# Patient Record
Sex: Female | Born: 1970 | Race: Black or African American | Hispanic: No | Marital: Married | Smoking: Never smoker
Health system: Southern US, Community
[De-identification: ages and names within clinical notes are randomized; demographics above are authoritative.]

## PROBLEM LIST (undated history)

## (undated) DIAGNOSIS — F419 Anxiety disorder, unspecified: Secondary | ICD-10-CM

## (undated) HISTORY — PX: CHOLECYSTECTOMY: SHX55

## (undated) HISTORY — DX: Anxiety disorder, unspecified: F41.9

---

## 1998-01-25 ENCOUNTER — Emergency Department (HOSPITAL_COMMUNITY): Admission: EM | Admit: 1998-01-25 | Discharge: 1998-01-25 | Payer: Self-pay | Admitting: Emergency Medicine

## 1999-10-09 ENCOUNTER — Emergency Department (HOSPITAL_COMMUNITY): Admission: EM | Admit: 1999-10-09 | Discharge: 1999-10-09 | Payer: Self-pay | Admitting: *Deleted

## 1999-10-09 ENCOUNTER — Encounter: Payer: Self-pay | Admitting: Emergency Medicine

## 1999-10-20 ENCOUNTER — Encounter (INDEPENDENT_AMBULATORY_CARE_PROVIDER_SITE_OTHER): Payer: Self-pay | Admitting: Specialist

## 1999-10-20 ENCOUNTER — Encounter: Payer: Self-pay | Admitting: Surgery

## 1999-10-20 ENCOUNTER — Observation Stay (HOSPITAL_COMMUNITY): Admission: EM | Admit: 1999-10-20 | Discharge: 1999-10-22 | Payer: Self-pay | Admitting: Surgery

## 1999-10-21 ENCOUNTER — Encounter: Payer: Self-pay | Admitting: *Deleted

## 2000-07-02 ENCOUNTER — Other Ambulatory Visit: Admission: RE | Admit: 2000-07-02 | Discharge: 2000-07-02 | Payer: Self-pay | Admitting: Obstetrics & Gynecology

## 2000-09-10 ENCOUNTER — Encounter: Payer: Self-pay | Admitting: Obstetrics & Gynecology

## 2000-09-10 ENCOUNTER — Inpatient Hospital Stay (HOSPITAL_COMMUNITY): Admission: AD | Admit: 2000-09-10 | Discharge: 2000-09-12 | Payer: Self-pay | Admitting: Obstetrics and Gynecology

## 2001-04-22 ENCOUNTER — Other Ambulatory Visit: Admission: RE | Admit: 2001-04-22 | Discharge: 2001-04-22 | Payer: Self-pay | Admitting: Obstetrics & Gynecology

## 2001-05-09 ENCOUNTER — Ambulatory Visit (HOSPITAL_COMMUNITY): Admission: RE | Admit: 2001-05-09 | Discharge: 2001-05-09 | Payer: Self-pay | Admitting: Obstetrics & Gynecology

## 2001-10-17 ENCOUNTER — Inpatient Hospital Stay (HOSPITAL_COMMUNITY): Admission: AD | Admit: 2001-10-17 | Discharge: 2001-10-17 | Payer: Self-pay | Admitting: Obstetrics & Gynecology

## 2001-10-24 ENCOUNTER — Inpatient Hospital Stay (HOSPITAL_COMMUNITY): Admission: AD | Admit: 2001-10-24 | Discharge: 2001-10-24 | Payer: Self-pay | Admitting: Obstetrics & Gynecology

## 2001-11-08 ENCOUNTER — Inpatient Hospital Stay (HOSPITAL_COMMUNITY): Admission: AD | Admit: 2001-11-08 | Discharge: 2001-11-10 | Payer: Self-pay | Admitting: Obstetrics & Gynecology

## 2005-01-26 ENCOUNTER — Emergency Department (HOSPITAL_COMMUNITY): Admission: EM | Admit: 2005-01-26 | Discharge: 2005-01-26 | Payer: Self-pay | Admitting: Emergency Medicine

## 2005-03-02 ENCOUNTER — Emergency Department (HOSPITAL_COMMUNITY): Admission: EM | Admit: 2005-03-02 | Discharge: 2005-03-02 | Payer: Self-pay | Admitting: Emergency Medicine

## 2006-02-20 ENCOUNTER — Ambulatory Visit: Payer: Self-pay | Admitting: Endocrinology

## 2006-03-01 ENCOUNTER — Ambulatory Visit: Payer: Self-pay | Admitting: Endocrinology

## 2007-05-31 ENCOUNTER — Encounter: Payer: Self-pay | Admitting: Endocrinology

## 2008-03-08 ENCOUNTER — Emergency Department (HOSPITAL_COMMUNITY): Admission: EM | Admit: 2008-03-08 | Discharge: 2008-03-08 | Payer: Self-pay | Admitting: Family Medicine

## 2009-12-27 ENCOUNTER — Emergency Department (HOSPITAL_COMMUNITY): Admission: EM | Admit: 2009-12-27 | Discharge: 2009-12-27 | Payer: Self-pay | Admitting: Family Medicine

## 2011-02-17 NOTE — H&P (Signed)
[facility]  Patient:    Sydney Simon, Sydney Simon Visit Number: [id number] MRN: [id number]          Service Type: OBS Location: [id number] Attending Physician:  Genia Del Dictated by:   Genia Del, M.D. Admit Date:  11/08/2001 Disc. Date: 11/08/01                           History and Physical  DATE OF BIRTH:                1971/07/26  IDENTIFICATION:               The patient is a 40 year old, G3, P1, AB1, last menstrual period Jan 31, 2001, for an expected date of delivery November 08, 2001.  REASON FOR ADMISSION:         Induction for borderline PIH.  HISTORY OF PRESENT ILLNESS:   Fetal movement positive. No vaginal bleeding. No fluid leak. Occasional uterine contractions. No PIH symptoms, except for occasional headaches and increased swelling in her lower limbs.  PAST MEDICAL HISTORY:         Negative.  PAST SURGICAL HISTORY:        Positive for cholecystectomy in February 2001, and therapeutic abortion in 1993.  PAST OBSTETRICAL HISTORY:     In 1993, therapeutic abortion, no complication. December 2001, 20-week premature preterm rupture of membranes with probably cervical incompetence. No evidence of preterm labor. Had spontaneous vaginal delivery with fetal demise.  ALLERGIES:                    No known drug allergies.  SOCIAL HISTORY:               Married. Nonsmoker.  MEDICATIONS:                  Prenatal vitamins.  HISTORY OF PRESENT PREGNANCY:                    The patient had her new OB visit at 11+ weeks gestation. An ultrasound confirmed dating at that time. A small subchorionic hematoma with a low-lying placenta was present; therefore, the cerclage procedure was postponed at 13+ weeks. The placenta was posterior, normal and subchorionic hematoma had resolved. A McDonald cerclage was, therefore, done on May 09, 2001, after informed consent. In the second trimester, the patient had a triple test done, which  was within normal limits. An ultrasound review anatomy done at [facility] was within normal limits. Her cervix remained stable, along the second trimester. In the third trimester, the cervix remained stable as well.  One-hour GTT at 28 weeks was increased at 145, three-hour GTT was normal. An ultrasound done at 36 weeks showed an estimated fetal weight at the 46th percentile, amniotic fluid index at the 71st percentile, biophysical profile 8/8. Group B strep at 35+ weeks was negative. At 37 weeks, on October 17, 2001, the cerclage was removed without difficulty at maternity admission. Her cervix, after removal of the cerclage at 37+ weeks, was 3 cm, 85% effaced, vertex -2/-1.  Examination at 37+ weeks, her blood pressures were slightly increased, numbers ranging from 136/96 to 142/94. Proteins were negative and PIH labs were negative. Repeat blood pressures on the left side went down to a normal range at 124/72. Given the borderline blood pressures with a very favorable cervix and history of incompetence, the decision was made to proceed with  induction on November 08, 2001.  REVIEW OF SYSTEMS: CONSTITUTIONAL: Negative. HEENT: Negative. RESPIRATORY: Negative. CARDIOVASCULAR: Negative. GASTROINTESTINAL: Negative. UROLOGIC: Negative. DERMATOLOGIC: Negative. ENDOCRINE: Negative. NEUROLOGIC: Negative.  PHYSICAL EXAMINATION:  GENERAL:                      No apparent distress.  VITAL SIGNS:                  Blood pressures on arrival 150/99, then down to 131/78, heart rate 117 on admission then decreased to 95 per minute. Temperature 98. Respiratory rate 20.  LUNGS:                        Clear bilaterally.  HEART:                        Regular cardiac rhythm. No murmur.  ABDOMEN:                      Gravid. Uterine height corresponds, nontender.  PELVIC:                       Vaginal examination, 3 cm, 80% effaced, vertex -3/-2.  EXTREMITIES:                  Lower limbs normal  with mild edema. No clonus.  Monitoring: No regular uterine contractions on admission. Fetal heart rate monitoring 130 per minute, good variability with accelerations. No decelerations.  IMPRESSION:                   Gravida 3, para 1, abortus 1, living children 0, at [redacted] weeks gestation. Induction for borderline pregnancy-induced hypertension. 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 Fetal well-being reassuring. Group B streptococcus negative.  PLAN:                         Admit to labor and delivery.  Monitoring. Induction with Pitocin and artificial rupture of membranes. Expectant nature towards probable vaginal delivery. Dictated by:   Genia Del, M.D. Attending Physician:  Genia Del DD:  11/08/01 TD:  11/08/01 Job: [id number]

## 2011-02-17 NOTE — H&P (Signed)
[facility]  Patient:    Sydney Simon, Sydney Simon                      MRN: [id number] Adm. Date:  09/10/00 Attending:  Lenoard Aden, M.D.                         History and Physical  CHIEF COMPLAINT:              Spotting.  HISTORY OF PRESENT ILLNESS:   [age]-year-old black female, G2, P0-0-1-0, EDD January 25, 2001, at 20-3/7 weeks, who presents with a less than 3-hour history of vaginal spotting.  No history of antecedent intercourse.  PAST MEDICAL HISTORY:         Cholecystectomy in February 2001.  Positive PPD, with a negative chest x-ray.  No other medical issues or hospitalizations.  FAMILY HISTORY:               Hypertension.  SOCIAL HISTORY:               Noncontributory.  ALLERGIES:                    No known drug allergies.  MEDICATIONS:                  Prenatal vitamins.  PRENATAL LABORATORY DATA:     Blood type O positive.  Rh antibody negative. Sickle cell trait negative.  Rubella immune.  Hepatitis B surface antigen negative.  Hemoglobin electrophoresis normal.  HIV nonreactive.  Triple screen performed at 16 weeks was normal.  PHYSICAL EXAMINATION:  GENERAL:                      Well-developed, well-nourished ______ female in no apparent distress.  VITAL SIGNS:                  Blood pressure 142/88.  NECK:                         No thyromegaly.  ABDOMEN:                      Soft, gravid, nontender.  Estimated fundal height of 22-23.  No CVA tenderness.  EXTREMITIES:                  No cords.  PELVIC:                       Cervical exam reveals bulging amniotic membranes with the cervix to be close to fully dilated, palpable cervix only from 10 to 2 oclock.  NEUROLOGIC:                   Nonfocal.  IMPRESSION:                   Questionable new-onset cervical incompetence with advanced cervical dilatation, now at 20-3/7 weeks.  PLAN:                         Admit.  Start IV antibiotics.  Complete obstetric  ultrasound ordered.  Bed rest.  Possible need for emergent cerclage. Discussed with the patient.  Will place on tocolysis only, and check fetal heart tones each shift. DD:  09/10/00 TD:  09/10/00 Job: [id number]

## 2011-02-17 NOTE — Op Note (Signed)
[facility]  Patient:    Sydney Simon, Sydney Simon Visit Number: [id number] MRN: [id number]          Service Type: DSU Location: [facility] Attending Physician:  Genia Del Proc. Date: 05/09/01 Admit Date:  05/09/2001                             Operative Report  DATE OF BIRTH:                [birth year].  PREOPERATIVE DIAGNOSIS:       A 14-week gestation with history of incompetent cervix.  POSTOPERATIVE DIAGNOSIS:      A 14-week gestation with history of incompetent cervix.  OPERATION:                    Cervical cerclage, McDonald technique.  SURGEON:                      Genia Del, M.D.  ASSISTANT:                    Sung Amabile. Roslyn Smiling, M.D.  ANESTHESIOLOGIST:             Raul Del, M.D.  ANESTHESIA:                   Spinal.  ESTIMATED BLOOD LOSS:         Minimal.  DESCRIPTION OF PROCEDURE:     Under spinal anesthesia, the patient is in lithotomy position. She is prepped with Betadine on the suprapubic, vulvar, and vaginal areas. The patient is then draped as usual. A bladder catheterization is done. We do a vaginal exam revealing a closed cervix measuring about 3 cm in length, no vaginal bleeding is present. The speculum is inserted. A Betadine prep is done once more at the cervix. We then grasp the cervix with a small fenestrated tenaculum at 3 oclock and 9 oclock. We start the cerclage at 12 oclock with a 5 mm Mersilene stitch on a large blunt needle. We go from 12 oclock to 3 oclock at about 2.5 cm from the external os. We push in the cervix slightly to make sure that we are distal to the bladder. We go from 12 oclock to 3 oclock and come out at that level, then we enter at 3 oclock to come out at 6 oclock, reentering at 6 oclock to 9 oclock, and then from 9 to 12 oclock at about 1 cm from the beginning of the cerclage. We then reenter at 12 oclock and come out at just beside the origin of the cerclage. We then place a 0  Dexon just within our Mersilene stitch. We make one knot and verify the tension. We then complete with about six flat knots. We then attach the 0 Dexon and then cut both sutures short. Hemostasis is adequate. We apply Betadine once more on the cervix. We remove the instruments. The vaginal exam reveals a long cervix, more than 3 cm, tightly closed, firmer than before the cerclage. The knot is at 12 oclock. We then ______ the bladder catheter, urine is clear, and a rectal exam is done. No suture material is felt. The estimated blood loss was minimal. No complication occurred and the patient was transferred to recovery room in good status. She received Ancef 1 g IV at the beginning of the intervention. Attending Physician:  Genia Del DD:  05/09/01  TD:  05/10/01 Job: [id number]

## 2013-02-25 ENCOUNTER — Other Ambulatory Visit: Payer: Self-pay | Admitting: Family Medicine

## 2013-02-25 DIAGNOSIS — Z1231 Encounter for screening mammogram for malignant neoplasm of breast: Secondary | ICD-10-CM

## 2013-03-07 ENCOUNTER — Ambulatory Visit
Admission: RE | Admit: 2013-03-07 | Discharge: 2013-03-07 | Disposition: A | Discharge status: Home / Self Care | Payer: 59 | Source: Ambulatory Visit | Attending: Family Medicine | Admitting: Family Medicine

## 2013-03-07 ENCOUNTER — Other Ambulatory Visit: Payer: Self-pay | Admitting: Family Medicine

## 2013-03-07 DIAGNOSIS — R05 Cough: Secondary | ICD-10-CM

## 2013-03-12 ENCOUNTER — Ambulatory Visit: Payer: 59 | Attending: Family Medicine | Admitting: Physical Therapy

## 2013-03-12 DIAGNOSIS — M25519 Pain in unspecified shoulder: Secondary | ICD-10-CM | POA: Insufficient documentation

## 2013-03-12 DIAGNOSIS — M25619 Stiffness of unspecified shoulder, not elsewhere classified: Secondary | ICD-10-CM | POA: Insufficient documentation

## 2013-03-12 DIAGNOSIS — R293 Abnormal posture: Secondary | ICD-10-CM | POA: Insufficient documentation

## 2013-03-12 DIAGNOSIS — IMO0001 Reserved for inherently not codable concepts without codable children: Secondary | ICD-10-CM | POA: Insufficient documentation

## 2013-03-25 ENCOUNTER — Ambulatory Visit: Payer: 59 | Admitting: Physical Therapy

## 2013-04-07 ENCOUNTER — Ambulatory Visit: Payer: 59 | Attending: Family Medicine | Admitting: Physical Therapy

## 2013-04-07 DIAGNOSIS — M25619 Stiffness of unspecified shoulder, not elsewhere classified: Secondary | ICD-10-CM | POA: Insufficient documentation

## 2013-04-07 DIAGNOSIS — R293 Abnormal posture: Secondary | ICD-10-CM | POA: Insufficient documentation

## 2013-04-07 DIAGNOSIS — IMO0001 Reserved for inherently not codable concepts without codable children: Secondary | ICD-10-CM | POA: Insufficient documentation

## 2013-04-07 DIAGNOSIS — M25519 Pain in unspecified shoulder: Secondary | ICD-10-CM | POA: Insufficient documentation

## 2013-04-10 ENCOUNTER — Ambulatory Visit: Payer: 59 | Admitting: Rehabilitation

## 2013-04-15 ENCOUNTER — Ambulatory Visit: Payer: 59 | Admitting: Rehabilitation

## 2013-04-22 ENCOUNTER — Ambulatory Visit: Payer: 59 | Admitting: Rehabilitation

## 2013-04-24 ENCOUNTER — Encounter: Payer: 59 | Admitting: Rehabilitation

## 2016-11-06 ENCOUNTER — Ambulatory Visit (INDEPENDENT_AMBULATORY_CARE_PROVIDER_SITE_OTHER): Payer: 59

## 2016-11-06 ENCOUNTER — Ambulatory Visit (INDEPENDENT_AMBULATORY_CARE_PROVIDER_SITE_OTHER): Payer: 59 | Admitting: Physician Assistant

## 2016-11-06 ENCOUNTER — Encounter: Payer: Self-pay | Admitting: Physician Assistant

## 2016-11-06 VITALS — BP 142/94 | HR 97 | Temp 99.1°F | Resp 22 | Wt 240.0 lb

## 2016-11-06 DIAGNOSIS — K5900 Constipation, unspecified: Secondary | ICD-10-CM | POA: Diagnosis not present

## 2016-11-06 DIAGNOSIS — R450 Nervousness: Secondary | ICD-10-CM | POA: Diagnosis not present

## 2016-11-06 DIAGNOSIS — R42 Dizziness and giddiness: Secondary | ICD-10-CM | POA: Diagnosis not present

## 2016-11-06 DIAGNOSIS — R002 Palpitations: Secondary | ICD-10-CM

## 2016-11-06 DIAGNOSIS — R6881 Early satiety: Secondary | ICD-10-CM

## 2016-11-06 MED ORDER — POLYETHYLENE GLYCOL 3350 17 GM/SCOOP PO POWD
17.0000 g | Freq: Two times a day (BID) | ORAL | 1 refills | Status: DC | PRN
Start: 2016-11-06 — End: 2022-03-10

## 2016-11-06 NOTE — Progress Notes (Signed)
Sydney Simon  MRN: [id number] DOB: 20-Sep-1971  PCP: No primary care provider on file.  Subjective:  Pt is a 46 year old female who presents to clinic for palpitations.  "stomach feels full" She eats a little and gets "stuffed full" This has been going on about 2 weeks. Feels like her bowels are not moving well.  H/o constipation. Does not strain with bowel movements.  Heart racing x 2 weeks. Her apple watch shows normal is 80's, this morning was 115. Happens randomly, no increased life stressors. She has "always felt anxious" but it has gotten worse the past 2 weeks. "I can be a keyed up person by nature". +feeling jittery, occasional light-headedness. She is concerned her sugars are high. No history of diabetes.  Stopped eating sugar about 2 weeks ago.  Stress test a few years ago - negative.  Family history - HTN.  No history of thyroid dysfunction.  Denies chest pain or pressure, headache, syncope, dizziness, diaphoresis, fatigue, nausea, vomiting.   Review of Systems  Constitutional: Positive for appetite change. Negative for chills, diaphoresis, fatigue and fever.  HENT: Negative for congestion, postnasal drip, rhinorrhea, sinus pressure, sneezing and sore throat.   Respiratory: Negative for cough, chest tightness, shortness of breath and wheezing.   Cardiovascular: Positive for palpitations. Negative for chest pain.  Gastrointestinal: Negative for abdominal pain, diarrhea, nausea and vomiting.  Neurological: Positive for light-headedness. Negative for weakness and headaches.  Psychiatric/Behavioral: The patient is nervous/anxious.     There are no active problems to display for this patient.   No current outpatient prescriptions on file prior to visit.   No current facility-administered medications on file prior to visit.     No Known Allergies   Objective:  BP (!) 142/94   Pulse 97   Temp 99.1 F (37.3 C) (Oral)   Resp (!) 22   Wt 240 lb (108.9 kg)   LMP  10/21/2016 (Approximate)   SpO2 100%   Physical Exam  Constitutional: She is oriented to person, place, and time and well-developed, well-nourished, and in no distress. No distress.  Cardiovascular: Normal rate, regular rhythm, S1 normal, S2 normal and normal heart sounds.   Pulmonary/Chest: Effort normal and breath sounds normal.  Abdominal: Soft. Normal appearance and bowel sounds are normal. There is no tenderness.  Neurological: She is alert and oriented to person, place, and time. GCS score is 15.  Skin: Skin is warm and dry.  Psychiatric: Mood, memory, affect and judgment normal.  Vitals reviewed.  Varney Biles Abd 2 Views  Result Date: 11/06/2016 CLINICAL DATA:  46 year old female with abdominal fullness and early satiety for 2 weeks. Initial encounter. EXAM: ABDOMEN - 2 VIEW COMPARISON:  [facility] Chest radiographs 03/07/2013 FINDINGS: Upright and supine views. No pneumoperitoneum. Negative visualized lung bases. Stable cholecystectomy clips. Non obstructed bowel gas pattern. Abdominal and pelvic visceral contours are within normal limits. No acute osseous abnormality identified. Endplate osteophytosis especially in the lower thoracic spine. IMPRESSION: Normal bowel gas pattern, no free air. Electronically Signed   By: Odessa Fleming M.D.   On: 11/06/2016 17:25    EKG shows NSR. No ischemia.  Assessment and Plan :  1. Jittery feeling 2. Palpitations 3. Light headedness 4. Early satiety 5. Constipation, unspecified constipation type - TSH - Basic metabolic panel - EKG 12-Lead - CBC with Differential/Platelet - DG Abd 2 Views; Future - polyethylene glycol powder (GLYCOLAX/MIRALAX) powder; Take 17 g by mouth 2 (two) times daily as needed.  Dispense: 578 g;  Refill: 1 - Abd x-ray shows stool burden LUQ - suspect this as the source of her recent early satiety. Will treat for constipation. Fiber supplement recommended. Encouraged increased fitness, fluids and fiber in her diet. Labs are  pending. Will contact w results.  RTC in 3-4 weeks for f/u symptoms. Consider further imaging/referral if early satiety does not improve.   Marco CollieWhitney Syndey Jaskolski, PA-C  [facility] 11/06/2016 4:51 PM

## 2016-11-06 NOTE — Patient Instructions (Addendum)
Your EKG looks great.  Your abdominal x-ray shows constipation. Please work on the 3 F's to prevent constipation: Fiber, Fitness and Fluids Please start getting regular exercise into your daily or weekly routine. Start by walking for 20 min a few times a week.  Drink 1-2 liters of water every day.  Take a fiber supplement.  I have prescribed you Miralax. See below for instructions to help clear you out. Choose one option.   Follow-up with me in one month so I can check in on your symptoms.  I will contact you with your lab results when they come back.   For constipation   Make sure you are drinking enough water daily. Make sure you are getting enough fiber in your diet - this will make you regular - you can eat high fiber foods or use metamucil as a supplement - it is really important to drink enough water when using fiber supplements.  If your stools are hard or are formed balls or you have to strain a stool softener will help - use colace 2-3 capsule a day  For gentle treatment of constipation Use Miralax 1-2 capfuls a day until your stools are soft and regular and then decrease the usage - you can use this daily  For more aggressive treatment of constipation Use 4 capfuls of Colace and 6 doses of Miralax and drink it in 2 hours - this should result in several watery stools - if it does not repeat the next day and then go to daily miralax for a week to make sure your bowels are clean and retrained to work properly  For the most aggressive treatment of constipation Use 14 capfuls of Miralax in 1 gallon of fluid (gatoraid or water work well or a combination of the two) and drink over 12h - it is ok to eat during this time and then use Miralax 1 capful daily for about 2 weeks to prevent the constipation from returning   Thank you for coming in today. I hope you feel we met your needs.  Feel free to call [facility] if you have any questions or further requests.  Please consider signing up for  MyChart if you do not already have it, as this is a great way to communicate with me.  Best,  Whitney McVey, PA-C  IF you received an x-ray today, you will receive an invoice from [facility]. Please contact [facility] at [phone] with questions or concerns regarding your invoice.   IF you received labwork today, you will receive an invoice from [facility]. Please contact [facility] at [phone] with questions or concerns regarding your invoice.   Our billing staff will not be able to assist you with questions regarding bills from these companies.  You will be contacted with the lab results as soon as they are available. The fastest way to get your results is to activate your My Chart account. Instructions are located on the last page of this paperwork. If you have not heard from [place] regarding the results in 2 weeks, please contact this office.

## 2016-11-07 LAB — CBC WITH DIFFERENTIAL/PLATELET
Basophils Absolute: 0 10*3/uL (ref 0.0–0.2)
Basos: 0 %
EOS (ABSOLUTE): 0.1 10*3/uL (ref 0.0–0.4)
Eos: 1 %
Hematocrit: 37.7 % (ref 34.0–46.6)
Hemoglobin: 12.5 g/dL (ref 11.1–15.9)
Immature Grans (Abs): 0 10*3/uL (ref 0.0–0.1)
Immature Granulocytes: 0 %
Lymphocytes Absolute: 1.9 10*3/uL (ref 0.7–3.1)
Lymphs: 42 %
MCH: 31.6 pg (ref 26.6–33.0)
MCHC: 33.2 g/dL (ref 31.5–35.7)
MCV: 95 fL (ref 79–97)
Monocytes Absolute: 0.4 10*3/uL (ref 0.1–0.9)
Monocytes: 9 %
Neutrophils Absolute: 2.2 10*3/uL (ref 1.4–7.0)
Neutrophils: 48 %
Platelets: 295 10*3/uL (ref 150–379)
RBC: 3.96 x10E6/uL (ref 3.77–5.28)
RDW: 13.3 % (ref 12.3–15.4)
WBC: 4.6 10*3/uL (ref 3.4–10.8)

## 2016-11-07 LAB — BASIC METABOLIC PANEL
BUN/Creatinine Ratio: 14 (ref 9–23)
BUN: 14 mg/dL (ref 6–24)
CO2: 21 mmol/L (ref 18–29)
Calcium: 9.6 mg/dL (ref 8.7–10.2)
Chloride: 102 mmol/L (ref 96–106)
Creatinine, Ser: 1 mg/dL (ref 0.57–1.00)
GFR calc Af Amer: 80 mL/min/{1.73_m2} (ref >59)
GFR calc non Af Amer: 69 mL/min/{1.73_m2} (ref >59)
Glucose: 87 mg/dL (ref 65–99)
Potassium: 4.5 mmol/L (ref 3.5–5.2)
Sodium: 139 mmol/L (ref 134–144)

## 2016-11-07 LAB — TSH: TSH: 1.08 u[IU]/mL (ref 0.450–4.500)

## 2017-11-15 ENCOUNTER — Encounter (HOSPITAL_COMMUNITY): Payer: Self-pay | Admitting: Emergency Medicine

## 2017-11-15 ENCOUNTER — Other Ambulatory Visit: Payer: Self-pay

## 2017-11-15 ENCOUNTER — Ambulatory Visit (HOSPITAL_COMMUNITY)
Admission: EM | Admit: 2017-11-15 | Discharge: 2017-11-15 | Disposition: A | Discharge status: Home / Self Care | Payer: 59 | Attending: Internal Medicine | Admitting: Internal Medicine

## 2017-11-15 DIAGNOSIS — K149 Disease of tongue, unspecified: Secondary | ICD-10-CM

## 2017-11-15 NOTE — ED Provider Notes (Signed)
[facility]    CSN: [id number] Arrival date & time: 11/15/17  1357     History   Chief Complaint Chief Complaint  Patient presents with  . Allergic Reaction    HPI Sydney Simon is a 47 y.o. female.   Sydney Simon presents with complaints of bumps to the back of her throat. She states while eating lunch today she took a Pepsin, which she takes quite regularly as she has had her gallbladder removed. She struggled with swallowing it and she felt it in the back of her throat, the capsule melted and she tasted the bitter taste of the inside of the capsule. She then went to take another herbal supplement, triphala, which she takes to help with her bowel regimen, and felt like her throat felt funny. She felt that she could see a bump to the back of her throat. She states at the time she was concerned it felt similar to her allergies to bee venom. She has not taken any benadryl. She has been able to drink liquids. She feels her symptoms have improved since waiting, that she had gotten anxious at original presentation of symptoms. Without swelling or shortness of breath. Without chest pain or palpitations. No skin rash. The medications are not new to her and she did not eat anything new at dinner.    ROS per HPI.       Past Medical History:  Diagnosis Date  . Anxiety     There are no active problems to display for this patient.   Past Surgical History:  Procedure Laterality Date  . CHOLECYSTECTOMY      OB History    No data available       Home Medications    Prior to Admission medications   Medication Sig Start Date End Date Taking? Authorizing Provider  Pepsin POWD by Does not apply route.   Yes [provider]  UNABLE TO FIND Med Name: Triphala   Yes [provider]  polyethylene glycol powder (GLYCOLAX/MIRALAX) powder Take 17 g by mouth 2 (two) times daily as needed. Patient not taking: Reported on 11/15/2017 11/06/16   McVey, Madelaine Bhat, PA-C    Family History Family History  Family history unknown: Yes    Social History Social History   Tobacco Use  . Smoking status: Never Smoker  . Smokeless tobacco: Never Used  Substance Use Topics  . Alcohol use: Yes  . Drug use: No     Allergies   Bee venom   Review of Systems Review of Systems   Physical Exam Triage Vital Signs ED Triage Vitals [11/15/17 1513]  Enc Vitals Group     BP (!) 148/105     Pulse Rate 85     Resp 18     Temp 98.1 F (36.7 C)     Temp src      SpO2 100 %     Weight      Height      Head Circumference      Peak Flow      Pain Score      Pain Loc      Pain Edu?      Excl. in GC?    No data found.  Updated Vital Signs BP (!) 148/105   Pulse 85   Temp 98.1 F (36.7 C)   Resp 18   LMP 11/15/2017   SpO2 100%   Visual Acuity Right Eye Distance:   Left Eye Distance:  Bilateral Distance:    Right Eye Near:   Left Eye Near:    Bilateral Near:     Physical Exam  Constitutional: She is oriented to person, place, and time. She appears well-developed and well-nourished. No distress.  HENT:  Head: Normocephalic and atraumatic.  Mouth/Throat: Uvula is midline, oropharynx is clear and moist and mucous membranes are normal. No uvula swelling. Tonsils are 1+ on the right. Tonsils are 1+ on the left. No tonsillar exudate.    Right tonsil with small area of swelling noted but without gross edema present; right posterior tongue with small flat raised lesion which patient states "wasn't there before." without edema of posterior oropharynx or uvula  Cardiovascular: Normal rate, regular rhythm and normal heart sounds.  Pulmonary/Chest: Effort normal and breath sounds normal. No stridor. No respiratory distress. She has no wheezes.  Neurological: She is alert and oriented to person, place, and time.  Skin: Skin is warm and dry.     UC Treatments / Results  Labs (all labs ordered are listed, but only abnormal results  are displayed) Labs Reviewed - No data to display  EKG  EKG Interpretation None       Radiology No results found.  Procedures Procedures (including critical care time)  Medications Ordered in UC Medications - No data to display   Initial Impression / Assessment and Plan / UC Course  I have reviewed the triage vital signs and the nursing notes.  Pertinent labs & imaging results that were available during my care of the patient were reviewed by me and considered in my medical decision making (see chart for details).     Patient reassured. She feels she has been improving since waiting. Drinking swallowing without difficulty. Non toxic in appearance. Without red flag findings on exam. Discussed possibility of open capsule causing irritation to her tongue vs allergic response. Patient states she will take benadryl at home for the next 24 hours and monitor symptoms. Return precautions provided. Patient verbalized understanding and agreeable to plan.    Final Clinical Impressions(s) / UC Diagnoses   Final diagnoses:  Tongue irritation    ED Discharge Orders    None       Controlled Substance Prescriptions [place] Controlled Substance Registry consulted? Not Applicable   Georgetta HaberBurky, Venus Ruhe B, NP 11/15/17 [phone]

## 2017-11-15 NOTE — ED Triage Notes (Signed)
Pt states "I was eating lunch today with an HCL pepsin 650mg  because I dont have a gallbladder, i've been taking them before, I tried to swallow ti because they are hard to swallow and it tasted bitter. I also take triphala. When I swallowed the triphala it felt funny. Now on the back of my tongue I have some sort of bump on my tongue. im not sure what did it".

## 2017-11-15 NOTE — Discharge Instructions (Signed)
It seems likely that when the capsule dissolved the medication which spilled could have irritated your tongue. You may still try taking benadryl for the next 24 hours to try to ease symptoms if these are allergic. If develop worsening of symptoms, throat swelling, difficulty breathing, wheezing, shortness of breath, facial swelling please return or go to the Er.

## 2018-08-07 IMAGING — DX DG ABDOMEN 2V
2 series · 2 of 2 positions shown · non-contrast
Comparison: [HOSPITAL] Chest radiographs 03/07/2013

CLINICAL DATA: 45-year-old female with abdominal fullness and early
satiety for 2 weeks. Initial encounter.

EXAM:
ABDOMEN - 2 VIEW

[abdomen erect]
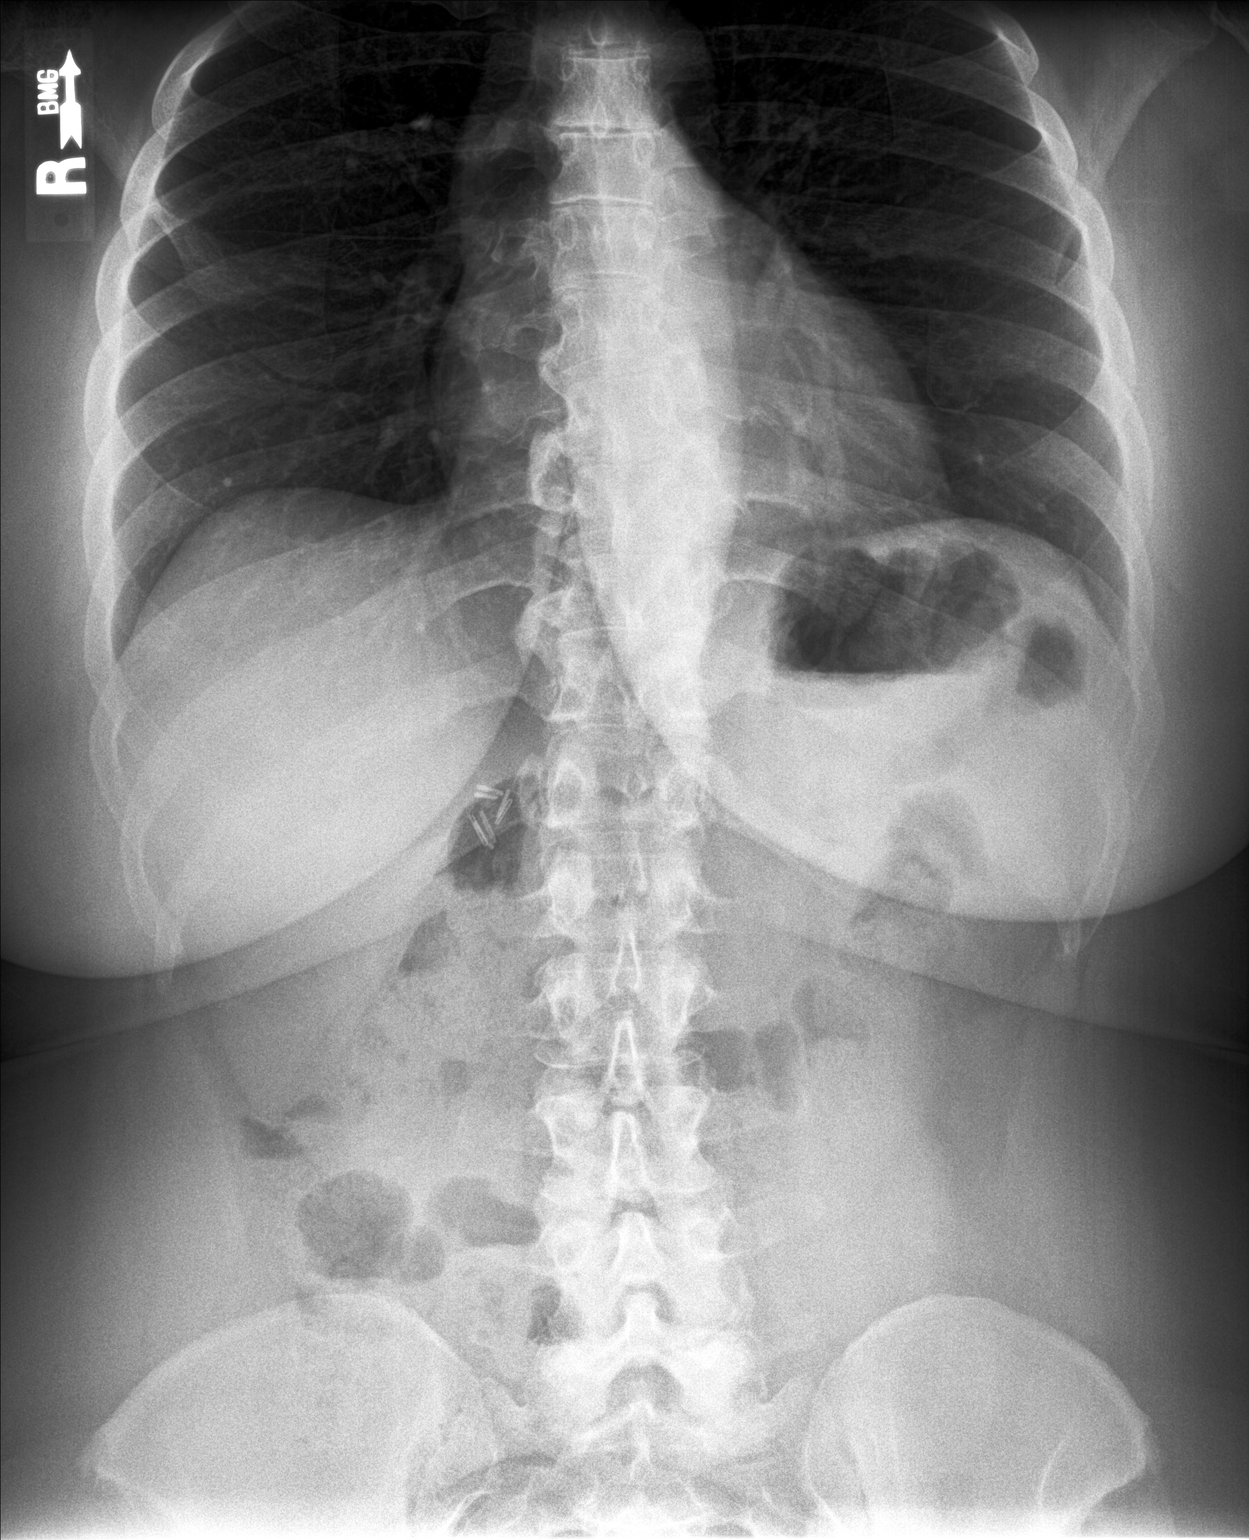

[abdomen supine]
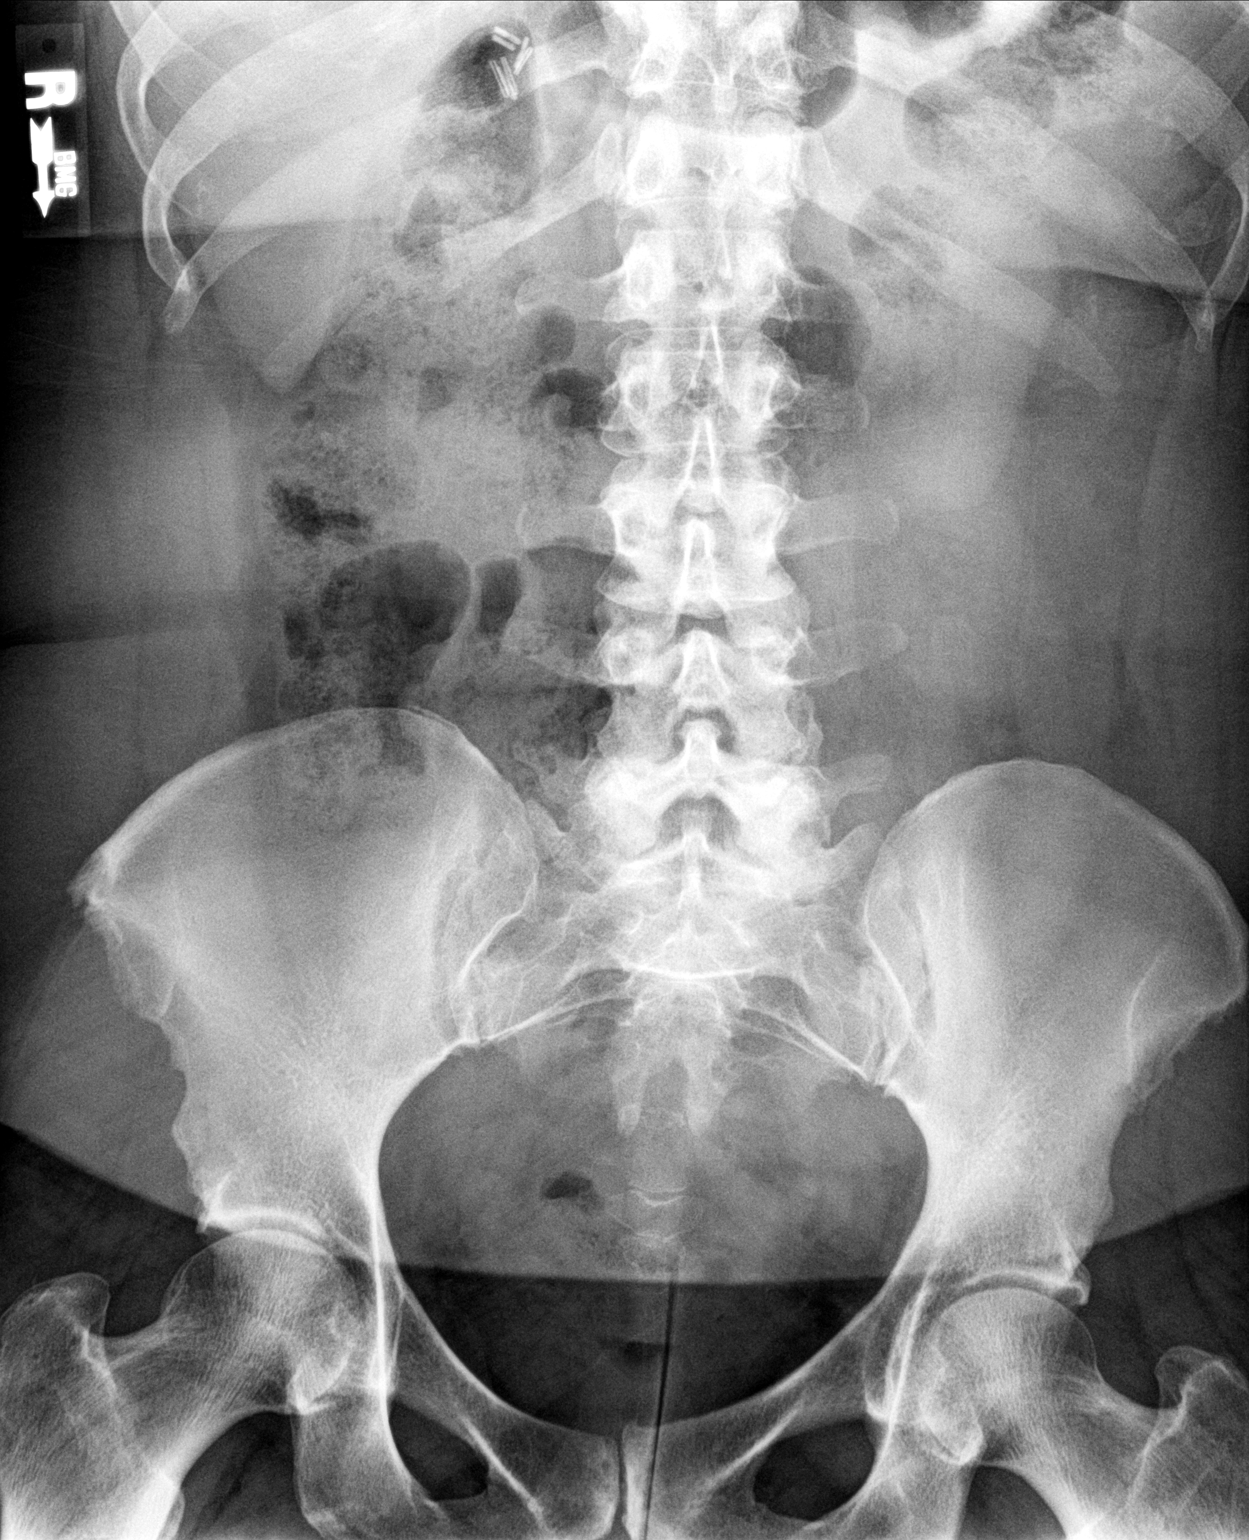

[2 of 2 positions shown; findings below may reference images not displayed]

FINDINGS: Upright and supine views. No pneumoperitoneum. Negative visualized
lung bases. Stable cholecystectomy clips. Non obstructed bowel gas
pattern. Abdominal and pelvic visceral contours are within normal
limits. No acute osseous abnormality identified. Endplate
osteophytosis especially in the lower thoracic spine.
IMPRESSION: Normal bowel gas pattern, no free air.

## 2022-03-09 ENCOUNTER — Ambulatory Visit: Payer: 59 | Admitting: Family Medicine

## 2022-03-10 ENCOUNTER — Encounter: Payer: Self-pay | Admitting: Orthopedic Surgery

## 2022-03-10 ENCOUNTER — Ambulatory Visit (INDEPENDENT_AMBULATORY_CARE_PROVIDER_SITE_OTHER): Payer: 59 | Admitting: Orthopedic Surgery

## 2022-03-10 VITALS — BP 130/86 | HR 88 | Temp 98.2°F | Ht 67.0 in | Wt 231.0 lb

## 2022-03-10 DIAGNOSIS — Z124 Encounter for screening for malignant neoplasm of cervix: Secondary | ICD-10-CM | POA: Diagnosis not present

## 2022-03-10 DIAGNOSIS — R030 Elevated blood-pressure reading, without diagnosis of hypertension: Secondary | ICD-10-CM | POA: Diagnosis not present

## 2022-03-10 DIAGNOSIS — Z6836 Body mass index (BMI) 36.0-36.9, adult: Secondary | ICD-10-CM

## 2022-03-10 DIAGNOSIS — E663 Overweight: Secondary | ICD-10-CM

## 2022-03-10 NOTE — Progress Notes (Signed)
Careteam: Sydney Simon Care Team: Sydney Simon, No Pcp Per (Inactive) as PCP - General (General Practice)  Seen by: Hazle Nordmann, AGNP-C  PLACE OF SERVICE:  [facility]  Advanced Directive information Does Sydney Simon Have a Medical Advance Directive?: No, Would Sydney Simon like information on creating a medical advance directive?: No - Sydney Simon declined  Allergies  Allergen Reactions   Bee Pollen    Bee Venom     Chief Complaint  Sydney Simon presents with   Establish Care    New Sydney Simon establishing care. Medication bottles not present.      HPI: Sydney Simon is a 51 y.o. female seen today to establish at [facility].   Cannot recall PCP. Thinks it was [employer] [address]. 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 Widowed. 1 daughter in college. Lives in 2 story house. 1 dog. Likes to go hiking and shopping.   High blood pressure- diagnosed in early 40's, other family members have HTN, she was taking HCTZ and beta blocker in past, not on medication at this time, she checks blood pressure at home, averages 140/80's, reports having a stress test in past due to elevated heart rate- has not been evaluated by cardiology  Denies any other health issues.   Denies past MI, stroke, and diabetes. No cancers.   No recent hospitalizations/injuries.    Lap cholecystomy in 2001 at [facility].   Does not have gynecologist. Last PAP smear unknown. Never had mammogram. LMP 02/13/2022. Cycles normal.   Family history:  Father passed at age 70 from brain tumor  Mother age 49, HTN, HLD, cancer on foot  Brother aged 68, no health issues  Sister aged 60, HTN  Maternal grandmother age 58, HTN  Paternal grandfather age 54, T2DM  Paternal grandparents deceased, unsure of health issues  Smoking: never smoked tobacco Drinking: occasional glass of wine, last drink last summer Illicit drug use: none, no past abuse  Diet: drinks coffee every morning, does not drink soda, drinks > 50 ounces daily, eats  about 3 servings fruits/vegetables daily, eats out about 2x/week Exercise: does not follow regular exercise plan, likes to walk at park with mom  Dentist: last dental cleaning was 4 years ago Eye: plans to have eye exam this year, not done in 2 years  Advanced directives: does not have living will   Review of Systems:  Review of Systems  Constitutional:  Negative for chills, fever, malaise/fatigue and weight loss.  HENT:  Negative for congestion and sore throat.   Eyes:  Negative for blurred vision and double vision.  Respiratory:  Negative for cough, shortness of breath and wheezing.   Cardiovascular:  Positive for leg swelling. Negative for chest pain.  Gastrointestinal:  Negative for abdominal pain, constipation, diarrhea, heartburn, nausea and vomiting.  Genitourinary:  Negative for dysuria and frequency.  Musculoskeletal:  Negative for joint pain and myalgias.  Skin:  Negative for rash.  Neurological:  Positive for dizziness. Negative for weakness and headaches.  Psychiatric/Behavioral:  Negative for depression. The Sydney Simon is not nervous/anxious and does not have insomnia.     Past Medical History:  Diagnosis Date   Anxiety    Past Surgical History:  Procedure Laterality Date   CHOLECYSTECTOMY     Social History:   reports that she has never smoked. She has never used smokeless tobacco. She reports current alcohol use. She reports that she does not use drugs.  Family History  Problem Relation Age of Onset   Hypertension Mother  Hypertension Sister     Medications: Sydney Simon's Medications  New Prescriptions   No medications on file  Previous Medications   FERROUS SULFATE PO    Take by mouth as needed.   MULTIPLE VITAMINS-MINERALS (MULTIVITAMIN ADULT EXTRA C) CHEW    Chew by mouth as needed.  Modified Medications   No medications on file  Discontinued Medications   PEPSIN POWD    by Does not apply route.   POLYETHYLENE GLYCOL POWDER (GLYCOLAX/MIRALAX) POWDER     Take 17 g by mouth 2 (two) times daily as needed.   UNABLE TO FIND    Med Name: Triphala    Physical Exam:  Vitals:   03/10/22 1019  BP: 130/86  Pulse: 88  Temp: 98.2 F (36.8 C)  TempSrc: Temporal  SpO2: 97%  Weight: 231 lb (104.8 kg)  Height: 5\' 7"  (1.702 m)   Body mass index is 36.18 kg/m. Wt Readings from Last 3 Encounters:  03/10/22 231 lb (104.8 kg)  11/06/16 240 lb (108.9 kg)  03/01/06 219 lb (99.3 kg)    Physical Exam Vitals reviewed.  Constitutional:      General: She is not in acute distress. HENT:     Head: Normocephalic.  Eyes:     General:        Right eye: No discharge.        Left eye: No discharge.  Cardiovascular:     Rate and Rhythm: Normal rate and regular rhythm.     Pulses: Normal pulses.     Heart sounds: Normal heart sounds.  Pulmonary:     Effort: Pulmonary effort is normal. No respiratory distress.     Breath sounds: Normal breath sounds. No wheezing.  Abdominal:     General: Bowel sounds are normal. There is no distension.     Palpations: Abdomen is soft.     Tenderness: There is no abdominal tenderness.  Musculoskeletal:     Cervical back: Neck supple.     Right lower leg: No edema.     Left lower leg: No edema.  Skin:    General: Skin is warm and dry.     Capillary Refill: Capillary refill takes less than 2 seconds.  Neurological:     General: No focal deficit present.     Mental Status: She is alert and oriented to person, place, and time.  Psychiatric:        Mood and Affect: Mood normal.        Behavior: Behavior normal.     Labs reviewed: Basic Metabolic Panel: No results for input(s): "NA", "K", "CL", "CO2", "GLUCOSE", "BUN", "CREATININE", "CALCIUM", "MG", "PHOS", "TSH" in the last 8760 hours. Liver Function Tests: No results for input(s): "AST", "ALT", "ALKPHOS", "BILITOT", "PROT", "ALBUMIN" in the last 8760 hours. No results for input(s): "LIPASE", "AMYLASE" in the last 8760 hours. No results for input(s):  "AMMONIA" in the last 8760 hours. CBC: No results for input(s): "WBC", "NEUTROABS", "HGB", "HCT", "MCV", "PLT" in the last 8760 hours. Lipid Panel: No results for input(s): "CHOL", "HDL", "LDLCALC", "TRIG", "CHOLHDL", "LDLDIRECT" in the last 8760 hours. TSH: No results for input(s): "TSH" in the last 8760 hours. A1C: No results found for: "HGBA1C"   Assessment/Plan 1. Elevated blood pressure reading - uncontrolled, reports home bp averaging 140-80's - reports past use of HCTZ and beta blocker - EKG NSR 2018 - recommend blood pressures twice daily x 1 week prior to next visit - limit sodium intake to < 2000 mg /day - CBC  with Differential/Platelet - CMP  2. Overweight - BMI 36.18 - recommend tracking calories with phone app x 1 week - limit calories to < 1500/day - recommend exercise 150 min/week - Lipid Panel  3. BMI 36.0-36.9,adult - see above - Lipid Panel  4. Pap smear for cervical cancer screening - last PAP unknown - Ambulatory referral to Gynecology  Total time: 42 minutes. Greater than 50% of total time spent doing Sydney Simon education regarding health maintenance, elevated blood pressure, advanced directives, weight loss/exercise.    Next appt: Visit date not found  Vedanshi Massaro Scherry Ran  [facility] [phone]

## 2022-03-10 NOTE — Patient Instructions (Signed)
Record blood pressures every morning and evening 1 week before next appointment  Exercise 150 minutes/week  Track calories with "my fitness pal" x 1 week  Gynecology referral made

## 2022-03-11 LAB — COMPREHENSIVE METABOLIC PANEL
AG Ratio: 1.2 (calc) (ref 1.0–2.5)
ALT: 9 U/L (ref 6–29)
AST: 14 U/L (ref 10–35)
Albumin: 4.3 g/dL (ref 3.6–5.1)
Alkaline phosphatase (APISO): 34 U/L (ref 31–125)
BUN: 13 mg/dL (ref 7–25)
CO2: 25 mmol/L (ref 20–32)
Calcium: 9.8 mg/dL (ref 8.6–10.2)
Chloride: 104 mmol/L (ref 98–110)
Creat: 0.93 mg/dL (ref 0.50–0.99)
Globulin: 3.5 g/dL (calc) (ref 1.9–3.7)
Glucose, Bld: 86 mg/dL (ref 65–99)
Potassium: 4.8 mmol/L (ref 3.5–5.3)
Sodium: 135 mmol/L (ref 135–146)
Total Bilirubin: 0.3 mg/dL (ref 0.2–1.2)
Total Protein: 7.8 g/dL (ref 6.1–8.1)

## 2022-03-11 LAB — CBC WITH DIFFERENTIAL/PLATELET
Absolute Monocytes: 405 cells/uL (ref 200–950)
Basophils Absolute: 20 cells/uL (ref 0–200)
Basophils Relative: 0.6 %
Eosinophils Absolute: 31 cells/uL (ref 15–500)
Eosinophils Relative: 0.9 %
HCT: 33.7 % — ABNORMAL LOW (ref 35.0–45.0)
Hemoglobin: 10.9 g/dL — ABNORMAL LOW (ref 11.7–15.5)
Lymphs Abs: 1238 cells/uL (ref 850–3900)
MCH: 29.3 pg (ref 27.0–33.0)
MCHC: 32.3 g/dL (ref 32.0–36.0)
MCV: 90.6 fL (ref 80.0–100.0)
MPV: 9.9 fL (ref 7.5–12.5)
Monocytes Relative: 11.9 %
Neutro Abs: 1707 cells/uL (ref 1500–7800)
Neutrophils Relative %: 50.2 %
Platelets: 303 10*3/uL (ref 140–400)
RBC: 3.72 10*6/uL — ABNORMAL LOW (ref 3.80–5.10)
RDW: 14.6 % (ref 11.0–15.0)
Total Lymphocyte: 36.4 %
WBC: 3.4 10*3/uL — ABNORMAL LOW (ref 3.8–10.8)

## 2022-03-11 LAB — LIPID PANEL
Cholesterol: 212 mg/dL — ABNORMAL HIGH (ref <200)
HDL: 45 mg/dL — ABNORMAL LOW (ref >=50)
LDL Cholesterol (Calc): 152 mg/dL (calc) — ABNORMAL HIGH
Non-HDL Cholesterol (Calc): 167 mg/dL (calc) — ABNORMAL HIGH (ref <130)
Total CHOL/HDL Ratio: 4.7 (calc) (ref <5.0)
Triglycerides: 57 mg/dL (ref <150)

## 2022-03-12 ENCOUNTER — Other Ambulatory Visit: Payer: Self-pay | Admitting: Orthopedic Surgery

## 2022-03-12 DIAGNOSIS — D649 Anemia, unspecified: Secondary | ICD-10-CM

## 2022-03-12 DIAGNOSIS — E7800 Pure hypercholesterolemia, unspecified: Secondary | ICD-10-CM

## 2022-04-10 ENCOUNTER — Encounter: Payer: Self-pay | Admitting: Family

## 2022-04-10 ENCOUNTER — Ambulatory Visit (INDEPENDENT_AMBULATORY_CARE_PROVIDER_SITE_OTHER): Payer: 59 | Admitting: Family

## 2022-04-10 VITALS — BP 140/80 | HR 97 | Temp 97.3°F | Resp 18 | Ht 67.0 in | Wt 236.0 lb

## 2022-04-10 DIAGNOSIS — R000 Tachycardia, unspecified: Secondary | ICD-10-CM

## 2022-04-10 DIAGNOSIS — I10 Essential (primary) hypertension: Secondary | ICD-10-CM

## 2022-04-10 DIAGNOSIS — D649 Anemia, unspecified: Secondary | ICD-10-CM

## 2022-04-10 LAB — CBC WITH DIFFERENTIAL/PLATELET
Absolute Monocytes: 277 cells/uL (ref 200–950)
Basophils Absolute: 30 cells/uL (ref 0–200)
Basophils Relative: 0.9 %
Eosinophils Absolute: 40 cells/uL (ref 15–500)
Eosinophils Relative: 1.2 %
HCT: 35.9 % (ref 35.0–45.0)
Hemoglobin: 11.7 g/dL (ref 11.7–15.5)
Lymphs Abs: 950 cells/uL (ref 850–3900)
MCH: 30.9 pg (ref 27.0–33.0)
MCHC: 32.6 g/dL (ref 32.0–36.0)
MCV: 94.7 fL (ref 80.0–100.0)
MPV: 9.8 fL (ref 7.5–12.5)
Monocytes Relative: 8.4 %
Neutro Abs: 2003 cells/uL (ref 1500–7800)
Neutrophils Relative %: 60.7 %
Platelets: 258 10*3/uL (ref 140–400)
RBC: 3.79 10*6/uL — ABNORMAL LOW (ref 3.80–5.10)
RDW: 15.8 % — ABNORMAL HIGH (ref 11.0–15.0)
Total Lymphocyte: 28.8 %
WBC: 3.3 10*3/uL — ABNORMAL LOW (ref 3.8–10.8)

## 2022-04-10 MED ORDER — HYDROCHLOROTHIAZIDE 12.5 MG PO TABS: 12.5000 mg | ORAL_TABLET | Freq: Every day | ORAL | 0 refills | Status: DC

## 2022-04-10 NOTE — Patient Instructions (Addendum)
- Advised to check Blood pressure at home and record on log provided and notify provider if B/p > 140/90   DASH Eating Plan DASH stands for Dietary Approaches to Stop Hypertension. The DASH eating plan is a healthy eating plan that has been shown to: Reduce high blood pressure (hypertension). Reduce your risk for type 2 diabetes, heart disease, and stroke. Help with weight loss. What are tips for following this plan? Reading food labels Check food labels for the amount of salt (sodium) per serving. Choose foods with less than 5 percent of the Daily Value of sodium. Generally, foods with less than 300 milligrams (mg) of sodium per serving fit into this eating plan. To find whole grains, look for the word "whole" as the first word in the ingredient list. Shopping Buy products labeled as "low-sodium" or "no salt added." Buy fresh foods. Avoid canned foods and pre-made or frozen meals. Cooking Avoid adding salt when cooking. Use salt-free seasonings or herbs instead of table salt or sea salt. Check with your health care provider or pharmacist before using salt substitutes. Do not fry foods. Cook foods using healthy methods such as baking, boiling, grilling, roasting, and broiling instead. Cook with heart-healthy oils, such as olive, canola, avocado, soybean, or sunflower oil. Meal planning  Eat a balanced diet that includes: 4 or more servings of fruits and 4 or more servings of vegetables each day. Try to fill one-half of your plate with fruits and vegetables. 6-8 servings of whole grains each day. Less than 6 oz (170 g) of lean meat, poultry, or fish each day. A 3-oz (85-g) serving of meat is about the same size as a deck of cards. One egg equals 1 oz (28 g). 2-3 servings of low-fat dairy each day. One serving is 1 cup (237 mL). 1 serving of nuts, seeds, or beans 5 times each week. 2-3 servings of heart-healthy fats. Healthy fats called omega-3 fatty acids are found in foods such as walnuts,  flaxseeds, fortified milks, and eggs. These fats are also found in cold-water fish, such as sardines, salmon, and mackerel. Limit how much you eat of: Canned or prepackaged foods. Food that is high in trans fat, such as some fried foods. Food that is high in saturated fat, such as fatty meat. Desserts and other sweets, sugary drinks, and other foods with added sugar. Full-fat dairy products. Do not salt foods before eating. Do not eat more than 4 egg yolks a week. Try to eat at least 2 vegetarian meals a week. Eat more home-cooked food and less restaurant, buffet, and fast food. Lifestyle When eating at a restaurant, ask that your food be prepared with less salt or no salt, if possible. If you drink alcohol: Limit how much you use to: 0-1 drink a day for women who are not pregnant. 0-2 drinks a day for men. Be aware of how much alcohol is in your drink. In the [place] one drink equals one 12 oz bottle of beer (355 mL), one 5 oz glass of wine (148 mL), or one 1 oz glass of hard liquor (44 mL). General information Avoid eating more than 2,300 mg of salt a day. If you have hypertension, you may need to reduce your sodium intake to 1,500 mg a day. Work with your health care provider to maintain a healthy body weight or to lose weight. Ask what an ideal weight is for you. Get at least 30 minutes of exercise that causes your heart to beat faster (aerobic   exercise) most days of the week. Activities may include walking, swimming, or biking. Work with your health care provider or dietitian to adjust your eating plan to your individual calorie needs. What foods should I eat? Fruits All fresh, dried, or frozen fruit. Canned fruit in natural juice (without added sugar). Vegetables Fresh or frozen vegetables (raw, steamed, roasted, or grilled). Low-sodium or reduced-sodium tomato and vegetable juice. Low-sodium or reduced-sodium tomato sauce and tomato paste. Low-sodium or reduced-sodium canned  vegetables. Grains Whole-grain or whole-wheat bread. Whole-grain or whole-wheat pasta. Brown rice. Oatmeal. Quinoa. Bulgur. Whole-grain and low-sodium cereals. Pita bread. Low-fat, low-sodium crackers. Whole-wheat flour tortillas. Meats and other proteins Skinless chicken or turkey. Ground chicken or turkey. Pork with fat trimmed off. Fish and seafood. Egg whites. Dried beans, peas, or lentils. Unsalted nuts, nut butters, and seeds. Unsalted canned beans. Lean cuts of beef with fat trimmed off. Low-sodium, lean precooked or cured meat, such as sausages or meat loaves. Dairy Low-fat (1%) or fat-free (skim) milk. Reduced-fat, low-fat, or fat-free cheeses. Nonfat, low-sodium ricotta or cottage cheese. Low-fat or nonfat yogurt. Low-fat, low-sodium cheese. Fats and oils Soft margarine without trans fats. Vegetable oil. Reduced-fat, low-fat, or light mayonnaise and salad dressings (reduced-sodium). Canola, safflower, olive, avocado, soybean, and sunflower oils. Avocado. Seasonings and condiments Herbs. Spices. Seasoning mixes without salt. Other foods Unsalted popcorn and pretzels. Fat-free sweets. The items listed above may not be a complete list of foods and beverages you can eat. Contact a dietitian for more information. What foods should I avoid? Fruits Canned fruit in a light or heavy syrup. Fried fruit. Fruit in cream or butter sauce. Vegetables Creamed or fried vegetables. Vegetables in a cheese sauce. Regular canned vegetables (not low-sodium or reduced-sodium). Regular canned tomato sauce and paste (not low-sodium or reduced-sodium). Regular tomato and vegetable juice (not low-sodium or reduced-sodium). Pickles. Olives. Grains Baked goods made with fat, such as croissants, muffins, or some breads. Dry pasta or rice meal packs. Meats and other proteins Fatty cuts of meat. Ribs. Fried meat. Bacon. Bologna, salami, and other precooked or cured meats, such as sausages or meat loaves. Fat from  the back of a pig (fatback). Bratwurst. Salted nuts and seeds. Canned beans with added salt. Canned or smoked fish. Whole eggs or egg yolks. Chicken or turkey with skin. Dairy Whole or 2% milk, cream, and half-and-half. Whole or full-fat cream cheese. Whole-fat or sweetened yogurt. Full-fat cheese. Nondairy creamers. Whipped toppings. Processed cheese and cheese spreads. Fats and oils Butter. Stick margarine. Lard. Shortening. Ghee. Bacon fat. Tropical oils, such as coconut, palm kernel, or palm oil. Seasonings and condiments Onion salt, garlic salt, seasoned salt, table salt, and sea salt. Worcestershire sauce. Tartar sauce. Barbecue sauce. Teriyaki sauce. Soy sauce, including reduced-sodium. Steak sauce. Canned and packaged gravies. Fish sauce. Oyster sauce. Cocktail sauce. Store-bought horseradish. Ketchup. Mustard. Meat flavorings and tenderizers. Bouillon cubes. Hot sauces. Pre-made or packaged marinades. Pre-made or packaged taco seasonings. Relishes. Regular salad dressings. Other foods Salted popcorn and pretzels. The items listed above may not be a complete list of foods and beverages you should avoid. Contact a dietitian for more information. Where to find more information National Heart, Lung, and Blood Institute: www.nhlbi.nih.gov American Heart Association: www.heart.org Academy of Nutrition and Dietetics: www.eatright.org National Kidney Foundation: www.kidney.org Summary The DASH eating plan is a healthy eating plan that has been shown to reduce high blood pressure (hypertension). It may also reduce your risk for type 2 diabetes, heart disease, and stroke. When on the DASH   eating plan, aim to eat more fresh fruits and vegetables, whole grains, lean proteins, low-fat dairy, and heart-healthy fats. With the DASH eating plan, you should limit salt (sodium) intake to 2,300 mg a day. If you have hypertension, you may need to reduce your sodium intake to 1,500 mg a day. Work with your  health care provider or dietitian to adjust your eating plan to your individual calorie needs. This information is not intended to replace advice given to you by your health care provider. Make sure you discuss any questions you have with your health care provider. Document Revised: 08/22/2019 Document Reviewed: 08/22/2019 Elsevier Patient Education  [place].

## 2022-04-10 NOTE — Progress Notes (Signed)
Provider: Richarda Blade FNP-C  Patient, No Pcp Per  Patient Care Team: Patient, No Pcp Per as PCP - General (General Practice)  Extended Emergency Contact Information Primary Emergency Contact: MCRAE,JACQUELINE Address: [address] [place],  [address] Home Phone: [phone] Relation: None  Code Status: Full Code  Goals of care: Advanced Directive information    04/10/2022    8:20 AM  Advanced Directives  Does Patient Have a Medical Advance Directive? No  Would patient like information on creating a medical advance directive? No - Patient declined     Chief Complaint  Patient presents with   Follow-up    Patient is here for a 4 WK F/U for low Hemoglobin     HPI:  Pt is a 51 y.o. female seen today for an acute visit for 4 weeks follow up of low hemoglobin 10.9 ( 03/10/2022 ).Taking Ferrous sulfate as needed.  No heavy bleeding or black/tarry stool.   States B/p sometime in the 110's and at times in the 150's.Has recently started walking exercises.Has tried to eliminate sugary foods and salt in the diet.Eating more salads but concerned that every time she tries to eat healthy she does not get enough nutrients.     Past Medical History:  Diagnosis Date   Anxiety    Past Surgical History:  Procedure Laterality Date   CHOLECYSTECTOMY      Allergies  Allergen Reactions   Bee Pollen    Bee Venom     Outpatient Encounter Medications as of 04/10/2022  Medication Sig   FERROUS SULFATE PO Take by mouth as needed.   Magnesium Chloride (MAGNESIUM DR PO) Take by mouth as needed.   Multiple Vitamins-Minerals (MULTIVITAMIN ADULT EXTRA C) CHEW Chew by mouth as needed.   No facility-administered encounter medications on file as of 04/10/2022.    Review of Systems  Constitutional:  Negative for appetite change, chills, fatigue, fever and unexpected weight change.  HENT:  Negative for congestion, rhinorrhea, sinus pressure, sinus pain, sneezing and sore  throat.   Eyes:  Negative for pain, discharge, redness, itching and visual disturbance.  Respiratory:  Negative for cough, chest tightness, shortness of breath and wheezing.   Cardiovascular:  Negative for chest pain, palpitations and leg swelling.  Gastrointestinal:  Negative for abdominal distention, abdominal pain, blood in stool, constipation, diarrhea, nausea and vomiting.  Endocrine: Negative for cold intolerance and heat intolerance.  Genitourinary:  Negative for difficulty urinating, dysuria, flank pain, frequency and urgency.  Musculoskeletal:  Negative for arthralgias, back pain and gait problem.  Skin:  Negative for color change, pallor and rash.  Neurological:  Negative for dizziness, syncope, speech difficulty, weakness, light-headedness, numbness and headaches.  Hematological:  Does not bruise/bleed easily.  Psychiatric/Behavioral:  Negative for agitation, behavioral problems, confusion, decreased concentration and sleep disturbance. The patient is not nervous/anxious.        Tends to be anxious but working on her deep breathing exercises     Immunization History  Administered Date(s) Administered   PFIZER(Purple Top)SARS-COV-2 Vaccination 12/12/2019, 01/02/2020, 07/03/2020   Td 03/06/2013   Tdap 03/06/2013   Pertinent  Health Maintenance Due  Topic Date Due   PAP SMEAR-Modifier  Never done   COLONOSCOPY (Pts 45-29yrs Insurance coverage will need to be confirmed)  Never done   INFLUENZA VACCINE  05/02/2022      11/06/2016    4:06 PM 03/10/2022   10:26 AM 04/10/2022    8:22 AM  Fall Risk  Falls in the past year? No 0 0  Was there an injury with Fall?  0 0  Fall Risk Category Calculator  0 0  Fall Risk Category  Low Low  Patient Fall Risk Level  Low fall risk Low fall risk  Patient at Risk for Falls Due to  No Fall Risks No Fall Risks  Fall risk Follow up  Falls evaluation completed Falls evaluation completed   Functional Status Survey:    Vitals:   04/10/22 0906   BP: 140/80  Pulse: 97  Resp: 18  Temp: (!) 97.3 F (36.3 C)  TempSrc: Temporal  SpO2: 97%  Weight: 236 lb (107 kg)  Height: 5\' 7"  (1.702 m)   Body mass index is 36.96 kg/m. Physical Exam Vitals reviewed.  Constitutional:      General: She is not in acute distress.    Appearance: Normal appearance. She is obese. She is not ill-appearing or diaphoretic.  HENT:     Head: Normocephalic.     Right Ear: Tympanic membrane, ear canal and external ear normal. There is no impacted cerumen.     Left Ear: Tympanic membrane, ear canal and external ear normal. There is no impacted cerumen.     Nose: Nose normal. No congestion or rhinorrhea.     Mouth/Throat:     Mouth: Mucous membranes are moist.     Pharynx: Oropharynx is clear. No oropharyngeal exudate or posterior oropharyngeal erythema.  Eyes:     General: No scleral icterus.       Right eye: No discharge.        Left eye: No discharge.     Extraocular Movements: Extraocular movements intact.     Conjunctiva/sclera: Conjunctivae normal.     Pupils: Pupils are equal, round, and reactive to light.  Neck:     Vascular: No carotid bruit.  Cardiovascular:     Rate and Rhythm: Regular rhythm. Tachycardia present.     Pulses: Normal pulses.     Heart sounds: Normal heart sounds. No murmur heard.    No friction rub. No gallop.  Pulmonary:     Effort: Pulmonary effort is normal. No respiratory distress.     Breath sounds: Normal breath sounds. No wheezing, rhonchi or rales.  Chest:     Chest wall: No tenderness.  Abdominal:     General: Bowel sounds are normal. There is no distension.     Palpations: Abdomen is soft. There is no mass.     Tenderness: There is no abdominal tenderness. There is no right CVA tenderness, left CVA tenderness, guarding or rebound.  Musculoskeletal:        General: No swelling or tenderness. Normal range of motion.     Cervical back: Normal range of motion. No rigidity or tenderness.     Right lower leg:  No edema.     Left lower leg: No edema.  Lymphadenopathy:     Cervical: No cervical adenopathy.  Skin:    General: Skin is warm and dry.     Coloration: Skin is not pale.     Findings: No bruising or erythema.     Comments: Red rash around neck area without any drainage  Neurological:     Mental Status: She is alert and oriented to person, place, and time.     Cranial Nerves: No cranial nerve deficit.     Sensory: No sensory deficit.     Motor: No weakness.     Coordination: Coordination normal.  Gait: Gait normal.  Psychiatric:        Mood and Affect: Mood normal.        Speech: Speech normal.        Behavior: Behavior normal.     Labs reviewed: Recent Labs    03/10/22 1120  NA 135  K 4.8  CL 104  CO2 25  GLUCOSE 86  BUN 13  CREATININE 0.93  CALCIUM 9.8   Recent Labs    03/10/22 1120  AST 14  ALT 9  BILITOT 0.3  PROT 7.8   Recent Labs    03/10/22 1120  WBC 3.4*  NEUTROABS 1,707  HGB 10.9*  HCT 33.7*  MCV 90.6  PLT 303   Lab Results  Component Value Date   TSH 1.080 11/06/2016   No results found for: "HGBA1C" Lab Results  Component Value Date   CHOL 212 (H) 03/10/2022   HDL 45 (L) 03/10/2022   LDLCALC 152 (H) 03/10/2022   TRIG 57 03/10/2022   CHOLHDL 4.7 03/10/2022    Significant Diagnostic Results in last 30 days:  No results found.  Assessment/Plan 1. Primary hypertension B/p not at goal  - asymptomatic.Has taken HZCT in the past.  - recently changed her diet and exercise by walking. -Restart Hydrochlorothiazide 12.5 mg tablet one by mouth daily  - Advised to check Blood pressure at home and record on log provided and notify provider if B/p > 140/90  - Dietary modification and exercise at least 3 times per week for 30 minutes advised. - additional DASH Eating plan Education information provided on AVS   2. Tachycardia HR 97 on arrival  - TSH - EKG 12-Lead indicates Normal sinus Rhythm no changes compared to EKG done 11/06/2016    3. Low hemoglobin Recent Hgb 10.9 No signs of bleeding or heavy menses reported.will recheck hgb  Recommend taking daily ferrous sulfate if still low  - advised to increase iron rich food in diet such as dark leafy veggies and beets.  - CBC with Differential/Platelet   Family/ staff Communication: Reviewed plan of care with patient verbalized understanding   Labs/tests ordered:  - EKG 12-Lead - CBC with Differential/Platelet - TSH   Next Appointment:Return in about 2 weeks (around 04/24/2022) for Blood pressure .   Caesar Bookman, NP

## 2022-04-11 ENCOUNTER — Other Ambulatory Visit: Payer: Self-pay

## 2022-04-11 LAB — TSH: TSH: 1.4 mIU/L

## 2022-04-18 ENCOUNTER — Ambulatory Visit: Payer: 59 | Admitting: Family Medicine

## 2022-04-24 ENCOUNTER — Ambulatory Visit: Payer: 59 | Admitting: Family

## 2022-05-01 ENCOUNTER — Encounter: Payer: Self-pay | Admitting: Family

## 2022-05-01 ENCOUNTER — Ambulatory Visit (INDEPENDENT_AMBULATORY_CARE_PROVIDER_SITE_OTHER): Payer: 59 | Admitting: Family

## 2022-05-01 VITALS — BP 130/80 | HR 66 | Temp 96.9°F | Resp 20 | Ht 67.0 in | Wt 231.4 lb

## 2022-05-01 DIAGNOSIS — I10 Essential (primary) hypertension: Secondary | ICD-10-CM

## 2022-05-01 MED ORDER — HYDROCHLOROTHIAZIDE 25 MG PO TABS: 25.0000 mg | ORAL_TABLET | Freq: Every day | ORAL | 1 refills | Status: DC

## 2022-05-01 NOTE — Progress Notes (Signed)
Provider: Richarda Blade FNP-C  Patient, No Pcp Per  Patient Care Team: Patient, No Pcp Per as PCP - General (General Practice)  Extended Emergency Contact Information Primary Emergency Contact: MCRAE,JACQUELINE Address: [address] [place],  [address] Home Phone: [phone] Relation: None  Code Status: Full Code  Goals of care: Advanced Directive information    04/10/2022    8:20 AM  Advanced Directives  Does Patient Have a Medical Advance Directive? No  Would patient like information on creating a medical advance directive? No - Patient declined     Chief Complaint  Patient presents with   Medical Management of Chronic Issues    Patient presents today for a 2 weeks blood pressure check.     HPI:  Pt is a 51 y.o. female seen today for an acute visit for 2 weeks follow up high blood pressure.  She was here on 04/10/2022 to establish care blood pressure was 140/80 had been off hydrochlorothiazide 12.5 mg.  Had recently changed her diet and exercising by walking.  Blood pressure today is within normal range.denies any headache,dizziness,vision changes,fatigue,chest tightness,palpitation,chest pain or shortness of breath.       Past Medical History:  Diagnosis Date   Anxiety    Past Surgical History:  Procedure Laterality Date   CHOLECYSTECTOMY      Allergies  Allergen Reactions   Bee Pollen    Bee Venom     Outpatient Encounter Medications as of 05/01/2022  Medication Sig   FERROUS SULFATE PO Take by mouth as needed.   hydrochlorothiazide (HYDRODIURIL) 12.5 MG tablet Take 1 tablet (12.5 mg total) by mouth daily.   Magnesium Chloride (MAGNESIUM DR PO) Take by mouth as needed.   Multiple Vitamins-Minerals (MULTIVITAMIN ADULT EXTRA C) CHEW Chew by mouth as needed.   vitamin C (ASCORBIC ACID) 500 MG tablet Take 500 mg by mouth daily.   No facility-administered encounter medications on file as of 05/01/2022.    Review of Systems  Constitutional:   Negative for appetite change, chills, fatigue, fever and unexpected weight change.  HENT:  Negative for dental problem.   Eyes:  Negative for pain, discharge, redness, itching and visual disturbance.  Respiratory:  Negative for cough, chest tightness, shortness of breath and wheezing.   Cardiovascular:  Negative for chest pain, palpitations and leg swelling.  Gastrointestinal:  Negative for abdominal distention, abdominal pain, blood in stool, constipation, diarrhea, nausea and vomiting.  Musculoskeletal:  Negative for arthralgias, back pain, gait problem and myalgias.  Skin:  Negative for color change, pallor and rash.  Neurological:  Negative for dizziness, syncope, speech difficulty, weakness, light-headedness, numbness and headaches.  Hematological:  Does not bruise/bleed easily.    Immunization History  Administered Date(s) Administered   PFIZER(Purple Top)SARS-COV-2 Vaccination 12/12/2019, 01/02/2020, 07/03/2020   Td 03/06/2013   Tdap 03/06/2013   Pertinent  Health Maintenance Due  Topic Date Due   PAP SMEAR-Modifier  Never done   COLONOSCOPY (Pts 45-46yrs Insurance coverage will need to be confirmed)  Never done   INFLUENZA VACCINE  05/02/2022      11/06/2016    4:06 PM 03/10/2022   10:26 AM 04/10/2022    8:22 AM  Fall Risk  Falls in the past year? No 0 0  Was there an injury with Fall?  0 0  Fall Risk Category Calculator  0 0  Fall Risk Category  Low Low  Patient Fall Risk Level  Low fall risk  Low fall risk  Patient at Risk for Falls Due to  No Fall Risks No Fall Risks  Fall risk Follow up  Falls evaluation completed Falls evaluation completed   Functional Status Survey:    Vitals:   05/01/22 1026  BP: 130/80  Pulse: 66  Resp: 20  Temp: (!) 96.9 F (36.1 C)  SpO2: 99%  Weight: 231 lb 6.4 oz (105 kg)  Height: 5\' 7"  (1.702 m)   Body mass index is 36.24 kg/m. Physical Exam Vitals reviewed.  Constitutional:      General: She is not in acute distress.     Appearance: Normal appearance. She is normal weight. She is not ill-appearing or diaphoretic.  HENT:     Head: Normocephalic.     Mouth/Throat:     Mouth: Mucous membranes are moist.     Pharynx: Oropharynx is clear. No oropharyngeal exudate or posterior oropharyngeal erythema.  Eyes:     General: No scleral icterus.       Right eye: No discharge.        Left eye: No discharge.     Conjunctiva/sclera: Conjunctivae normal.     Pupils: Pupils are equal, round, and reactive to light.  Neck:     Vascular: No carotid bruit.  Cardiovascular:     Rate and Rhythm: Normal rate and regular rhythm.     Pulses: Normal pulses.     Heart sounds: Normal heart sounds. No murmur heard.    No friction rub. No gallop.  Pulmonary:     Effort: Pulmonary effort is normal. No respiratory distress.     Breath sounds: Normal breath sounds. No wheezing, rhonchi or rales.  Chest:     Chest wall: No tenderness.  Abdominal:     General: Bowel sounds are normal. There is no distension.     Palpations: Abdomen is soft. There is no mass.     Tenderness: There is no abdominal tenderness. There is no right CVA tenderness, left CVA tenderness, guarding or rebound.  Musculoskeletal:        General: No swelling or tenderness. Normal range of motion.     Cervical back: Normal range of motion. No rigidity or tenderness.     Right lower leg: No edema.     Left lower leg: No edema.  Lymphadenopathy:     Cervical: No cervical adenopathy.  Skin:    General: Skin is warm and dry.     Coloration: Skin is not pale.     Findings: No erythema or rash.  Neurological:     Mental Status: She is alert and oriented to person, place, and time.     Motor: No weakness.     Gait: Gait normal.  Psychiatric:        Mood and Affect: Mood normal.        Speech: Speech normal.        Behavior: Behavior normal.     Labs reviewed: Recent Labs    03/10/22 1120  NA 135  K 4.8  CL 104  CO2 25  GLUCOSE 86  BUN 13   CREATININE 0.93  CALCIUM 9.8   Recent Labs    03/10/22 1120  AST 14  ALT 9  BILITOT 0.3  PROT 7.8   Recent Labs    03/10/22 1120 04/10/22 1019  WBC 3.4* 3.3*  NEUTROABS 1,707 2,003  HGB 10.9* 11.7  HCT 33.7* 35.9  MCV 90.6 94.7  PLT 303 258   Lab Results  Component  Value Date   TSH 1.40 04/10/2022   No results found for: "HGBA1C" Lab Results  Component Value Date   CHOL 212 (H) 03/10/2022   HDL 45 (L) 03/10/2022   LDLCALC 152 (H) 03/10/2022   TRIG 57 03/10/2022   CHOLHDL 4.7 03/10/2022    Significant Diagnostic Results in last 30 days:  No results found.  Assessment/Plan  Primary hypertension Blood pressure has improved at goal -Continue on hydrochlorothiazide - Advised to check Blood pressure at home and record on log provided and notify provider if B/p > 140/90 Continue dietary modification and exercise - hydrochlorothiazide (HYDRODIURIL) 25 MG tablet; Take 1 tablet (25 mg total) by mouth daily.  Dispense: 90 tablet; Refill: 1   Family/ staff Communication: Reviewed plan of care with patient verbalized understanding  Labs/tests ordered: None   Next Appointment: Return in about 4 months (around 08/31/2022) for medical mangement of chronic issues.Caesar Bookman, NP

## 2022-05-04 ENCOUNTER — Encounter: Payer: 59 | Admitting: Obstetrics

## 2022-05-08 DIAGNOSIS — I10 Essential (primary) hypertension: Secondary | ICD-10-CM | POA: Insufficient documentation

## 2022-06-12 ENCOUNTER — Other Ambulatory Visit: Payer: 59

## 2022-06-14 ENCOUNTER — Encounter: Payer: Self-pay | Admitting: Obstetrics

## 2022-06-26 ENCOUNTER — Other Ambulatory Visit: Payer: 59

## 2022-06-26 DIAGNOSIS — E7800 Pure hypercholesterolemia, unspecified: Secondary | ICD-10-CM

## 2022-06-27 ENCOUNTER — Other Ambulatory Visit: Payer: Self-pay

## 2022-06-27 DIAGNOSIS — I10 Essential (primary) hypertension: Secondary | ICD-10-CM

## 2022-06-27 LAB — LIPID PANEL
Cholesterol: 208 mg/dL — ABNORMAL HIGH (ref <200)
HDL: 56 mg/dL (ref >=50)
LDL Cholesterol (Calc): 130 mg/dL (calc) — ABNORMAL HIGH
Non-HDL Cholesterol (Calc): 152 mg/dL (calc) — ABNORMAL HIGH (ref <130)
Total CHOL/HDL Ratio: 3.7 (calc) (ref <5.0)
Triglycerides: 111 mg/dL (ref <150)

## 2022-06-27 MED ORDER — HYDROCHLOROTHIAZIDE 25 MG PO TABS: 25.0000 mg | ORAL_TABLET | Freq: Every day | ORAL | 1 refills | Status: DC

## 2022-06-27 MED ORDER — ROSUVASTATIN CALCIUM 5 MG PO TABS: 5.0000 mg | ORAL_TABLET | Freq: Every day | ORAL | 3 refills | Status: DC

## 2022-09-07 ENCOUNTER — Ambulatory Visit: Payer: 59 | Admitting: Family

## 2022-09-12 ENCOUNTER — Ambulatory Visit (INDEPENDENT_AMBULATORY_CARE_PROVIDER_SITE_OTHER): Payer: 59 | Admitting: Family

## 2022-09-12 ENCOUNTER — Encounter: Payer: Self-pay | Admitting: Family

## 2022-09-12 VITALS — BP 138/88 | HR 96 | Temp 97.8°F | Resp 17 | Ht 67.0 in | Wt 235.2 lb

## 2022-09-12 DIAGNOSIS — Z1211 Encounter for screening for malignant neoplasm of colon: Secondary | ICD-10-CM

## 2022-09-12 DIAGNOSIS — I10 Essential (primary) hypertension: Secondary | ICD-10-CM | POA: Diagnosis not present

## 2022-09-12 DIAGNOSIS — E7800 Pure hypercholesterolemia, unspecified: Secondary | ICD-10-CM | POA: Diagnosis not present

## 2022-09-12 DIAGNOSIS — Z124 Encounter for screening for malignant neoplasm of cervix: Secondary | ICD-10-CM

## 2022-09-12 DIAGNOSIS — D649 Anemia, unspecified: Secondary | ICD-10-CM | POA: Diagnosis not present

## 2022-09-12 DIAGNOSIS — Z1159 Encounter for screening for other viral diseases: Secondary | ICD-10-CM

## 2022-09-12 DIAGNOSIS — Z113 Encounter for screening for infections with a predominantly sexual mode of transmission: Secondary | ICD-10-CM

## 2022-09-12 MED ORDER — HYDROCHLOROTHIAZIDE 25 MG PO TABS
25.0000 mg | ORAL_TABLET | Freq: Every day | ORAL | 1 refills | Status: DC
Start: 2022-09-12 — End: 2023-09-17

## 2022-09-12 NOTE — Progress Notes (Signed)
Provider: Richarda Bladeinah Jenness Stemler FNP-C   Lakeith Careaga, Donalee Citrininah C, NP  Patient Care Team: Gao Mitnick, Donalee Citrininah C, NP as PCP - General (Family Medicine)  Extended Emergency Contact Information Primary Emergency Contact: [place] Simon,Sydney Simon Address: [address] [place]          [place],  [id number] Home Phone: [phone] Relation: None  Code Status:  Full Code  Goals of care: Advanced Directive information    09/12/2022    2:08 PM  Advanced Directives  Does Patient Have a Medical Advance Directive? Yes  Type of Estate agentAdvance Directive Healthcare Power of Court[place]Attorney;Living will  Does patient want to make changes to medical advance directive? No - Patient declined  Copy of Healthcare Power of Attorney in Chart? No - copy requested     Chief Complaint  Patient presents with   Medical Management of Chronic Issues    4 month follow up.    Health Maintenance    Discuss the need for HIV Screening, Hepatitis C Screening, Pap smear, and Colonoscopy.    Immunizations    Discuss the need for Influenza vaccine, and Covid Booster.    HPI:  Pt is a 51 y.o. female seen today for 4 months follow up for medical management of chronic diseases.    Hyperlipidemia - no changes in diet has been out of town recently started on Crestor.  Hypertension - no home B/p readings.she denies any headache,dizziness,vision changes,fatigue,chest tightness,palpitation,chest pain or shortness of breath.    Anxiety Feels shaky.usually takes deep breaths which helps to calm her down.  Anemia - takes ferrous sulfate every other day to prevent constipation.   Health Maintenance :  Due for Hep C,HIV screening.Also due for Pap smear and Colonoscopy.prefers Cologuard instead of colonoscopy. Due for Influenza and COVId-19 booster.    Past Medical History:  Diagnosis Date   Anxiety    Past Surgical History:  Procedure Laterality Date   CHOLECYSTECTOMY      Allergies  Allergen Reactions   Bee Pollen    Bee Venom     Allergies as  of 09/12/2022       Reactions   Bee Pollen    Bee Venom         Medication List        Accurate as of September 12, 2022  2:34 PM. If you have any questions, ask your nurse or doctor.          ascorbic acid 500 MG tablet Commonly known as: VITAMIN C Take 500 mg by mouth daily.   FERROUS SULFATE PO Take by mouth as needed.   hydrochlorothiazide 25 MG tablet Commonly known as: HYDRODIURIL Take 1 tablet (25 mg total) by mouth daily.   MAGNESIUM DR PO Take by mouth as needed.   Multivitamin Adult Extra C Chew Chew by mouth as needed.   rosuvastatin 5 MG tablet Commonly known as: Crestor Take 1 tablet (5 mg total) by mouth daily.        Review of Systems  Constitutional:  Negative for appetite change, chills, fatigue, fever and unexpected weight change.  HENT:  Negative for congestion, dental problem, ear discharge, ear pain, facial swelling, hearing loss, nosebleeds, postnasal drip, rhinorrhea, sinus pressure, sinus pain, sneezing, sore throat, tinnitus and trouble swallowing.   Eyes:  Negative for pain, discharge, redness, itching and visual disturbance.  Respiratory:  Negative for cough, chest tightness, shortness of breath and wheezing.   Cardiovascular:  Negative for chest pain, palpitations and leg swelling.  Gastrointestinal:  Negative for abdominal  distention, abdominal pain, blood in stool, constipation, diarrhea, nausea and vomiting.  Endocrine: Negative for cold intolerance, heat intolerance, polydipsia, polyphagia and polyuria.  Genitourinary:  Negative for difficulty urinating, dysuria, flank pain, frequency and urgency.  Musculoskeletal:  Negative for arthralgias, back pain, gait problem, joint swelling, myalgias, neck pain and neck stiffness.  Skin:  Negative for color change, pallor, rash and wound.  Neurological:  Negative for dizziness, syncope, speech difficulty, weakness, light-headedness, numbness and headaches.  Hematological:  Does not  bruise/bleed easily.  Psychiatric/Behavioral:  Negative for agitation, behavioral problems, confusion, hallucinations, self-injury, sleep disturbance and suicidal ideas. The patient is nervous/anxious.     Immunization History  Administered Date(s) Administered   PFIZER(Purple Top)SARS-COV-2 Vaccination 12/12/2019, 01/02/2020, 07/03/2020   Td 03/06/2013   Tdap 03/06/2013   Pertinent  Health Maintenance Due  Topic Date Due   PAP SMEAR-Modifier  Never done   COLONOSCOPY (Pts 45-53yrs Insurance coverage will need to be confirmed)  Never done   INFLUENZA VACCINE  Never done      11/06/2016    4:06 PM 03/10/2022   10:26 AM 04/10/2022    8:22 AM 09/12/2022    2:08 PM  Fall Risk  Falls in the past year? No 0 0 0  Was there an injury with Fall?  0 0 0  Fall Risk Category Calculator  0 0 0  Fall Risk Category  Low Low Low  Patient Fall Risk Level  Low fall risk Low fall risk Low fall risk  Patient at Risk for Falls Due to  No Fall Risks No Fall Risks No Fall Risks  Fall risk Follow up  Falls evaluation completed Falls evaluation completed Falls evaluation completed   Functional Status Survey:    Vitals:   09/12/22 1407  BP: 138/88  Pulse: 96  Resp: 17  Temp: 97.8 F (36.6 C)  SpO2: 98%  Weight: 235 lb 3.2 oz (106.7 kg)  Height: 5\' 7"  (1.702 m)   Body mass index is 36.84 kg/m. Physical Exam Vitals reviewed.  Constitutional:      General: She is not in acute distress.    Appearance: Normal appearance. She is obese. She is not ill-appearing or diaphoretic.  HENT:     Head: Normocephalic.     Right Ear: Tympanic membrane, ear canal and external ear normal. There is no impacted cerumen.     Left Ear: Tympanic membrane, ear canal and external ear normal. There is no impacted cerumen.     Nose: Nose normal. No congestion or rhinorrhea.     Mouth/Throat:     Mouth: Mucous membranes are moist.     Pharynx: Oropharynx is clear. No oropharyngeal exudate or posterior oropharyngeal  erythema.  Eyes:     General: No scleral icterus.       Right eye: No discharge.        Left eye: No discharge.     Extraocular Movements: Extraocular movements intact.     Conjunctiva/sclera: Conjunctivae normal.     Pupils: Pupils are equal, round, and reactive to light.  Neck:     Vascular: No carotid bruit.  Cardiovascular:     Rate and Rhythm: Normal rate and regular rhythm.     Pulses: Normal pulses.     Heart sounds: Normal heart sounds. No murmur heard.    No friction rub. No gallop.  Pulmonary:     Effort: Pulmonary effort is normal. No respiratory distress.     Breath sounds: Normal breath sounds. No wheezing, rhonchi  or rales.  Chest:     Chest wall: No tenderness.  Abdominal:     General: Bowel sounds are normal. There is no distension.     Palpations: Abdomen is soft. There is no mass.     Tenderness: There is no abdominal tenderness. There is no right CVA tenderness, left CVA tenderness, guarding or rebound.  Musculoskeletal:        General: No swelling or tenderness. Normal range of motion.     Cervical back: Normal range of motion. No rigidity or tenderness.     Right lower leg: No edema.     Left lower leg: No edema.  Lymphadenopathy:     Cervical: No cervical adenopathy.  Skin:    General: Skin is warm and dry.     Coloration: Skin is not pale.     Findings: No bruising, erythema, lesion or rash.  Neurological:     Mental Status: She is alert and oriented to person, place, and time.     Cranial Nerves: No cranial nerve deficit.     Sensory: No sensory deficit.     Motor: No weakness.     Coordination: Coordination normal.     Gait: Gait normal.  Psychiatric:        Mood and Affect: Mood normal.        Speech: Speech normal.        Behavior: Behavior normal.        Thought Content: Thought content normal.        Judgment: Judgment normal.    Labs reviewed: Recent Labs    03/10/22 1120  NA 135  K 4.8  CL 104  CO2 25  GLUCOSE 86  BUN 13   CREATININE 0.93  CALCIUM 9.8   Recent Labs    03/10/22 1120  AST 14  ALT 9  BILITOT 0.3  PROT 7.8   Recent Labs    03/10/22 1120 04/10/22 1019  WBC 3.4* 3.3*  NEUTROABS 1,707 2,003  HGB 10.9* 11.7  HCT 33.7* 35.9  MCV 90.6 94.7  PLT 303 258   Lab Results  Component Value Date   TSH 1.40 04/10/2022   No results found for: "HGBA1C" Lab Results  Component Value Date   CHOL 208 (H) 06/26/2022   HDL 56 06/26/2022   LDLCALC 130 (H) 06/26/2022   TRIG 111 06/26/2022   CHOLHDL 3.7 06/26/2022    Significant Diagnostic Results in last 30 days:  No results found.  Assessment/Plan 1. Primary hypertension B/p well controlled  - continue on hydrochlorothiazide  - hydrochlorothiazide (HYDRODIURIL) 25 MG tablet; Take 1 tablet (25 mg total) by mouth daily.  Dispense: 90 tablet; Refill: 1 - TSH; Future - COMPLETE METABOLIC PANEL WITH GFR; Future - CBC with Differential/Platelet; Future  2. Pure hypercholesterolemia LDL not at goal  Rosuvastatin  - dietary modification and exercise  - Lipid panel; Future  3. Pap smear for cervical cancer screening Asymptomatic  - Ambulatory referral to Gynecology  4. Low hemoglobin Previous low hgb  - continue on Ferrous sulfate and MVI  5. Colon cancer screening Asymptomatic  Prefers Cologuard verse colonoscopy  - Cologuard  6. Encounter for hepatitis C screening test for low risk patient Low risk  - Hepatitis C antibody; Future  7. Screen for STD (sexually transmitted disease) Reports no high risk behaviors  - HIV Antibody (routine testing w rflx); Future  Family/ staff Communication: Reviewed plan of care with patient verbalized understanding.  Labs/tests ordered:  - TSH;  Future - COMPLETE METABOLIC PANEL WITH GFR; Future - CBC with Differential/Platelet; Future - Cologuard - HIV Antibody (routine testing w rflx); Future - Hepatitis C antibody; Future  Next Appointment : Return in about 6 months (around 03/14/2023)  for medical mangement of chronic issues., Fasting labs in 6 months prior to visit.  Caesar Bookman, NP

## 2022-10-18 ENCOUNTER — Other Ambulatory Visit: Payer: Self-pay

## 2022-10-18 ENCOUNTER — Encounter: Payer: Self-pay | Admitting: Nurse Practitioner

## 2022-10-18 ENCOUNTER — Ambulatory Visit (INDEPENDENT_AMBULATORY_CARE_PROVIDER_SITE_OTHER): Payer: 59 | Admitting: Nurse Practitioner

## 2022-10-18 VITALS — BP 130/84 | HR 94 | Temp 97.3°F | Ht 67.0 in | Wt 238.0 lb

## 2022-10-18 DIAGNOSIS — E7800 Pure hypercholesterolemia, unspecified: Secondary | ICD-10-CM

## 2022-10-18 DIAGNOSIS — I10 Essential (primary) hypertension: Secondary | ICD-10-CM | POA: Diagnosis not present

## 2022-10-18 DIAGNOSIS — D649 Anemia, unspecified: Secondary | ICD-10-CM | POA: Diagnosis not present

## 2022-10-18 DIAGNOSIS — R42 Dizziness and giddiness: Secondary | ICD-10-CM | POA: Diagnosis not present

## 2022-10-18 DIAGNOSIS — F419 Anxiety disorder, unspecified: Secondary | ICD-10-CM

## 2022-10-18 NOTE — Patient Instructions (Addendum)
Increase water intake 64 oz a day  Make sure you are eating 3 meals a day.

## 2022-10-18 NOTE — Progress Notes (Signed)
Careteam: Patient Care Team: Ngetich, Donalee Citrin, NP as PCP - General (Family Medicine)  PLACE OF SERVICE:  [facility]  Advanced Directive information    Allergies  Allergen Reactions   Bee Pollen    Bee Venom     Chief Complaint  Patient presents with   Acute Visit    Dizzy episode today, body trembling, and feels shaky      HPI: Patient is a 52 y.o. female here for an acute visit.  Trembling for a few months all over her whole body.  She notices it when she sits still or goes to bed at night. Not a large trembling but aware of it. Today dizziness and headache. Slightly photophobic. Feels the headache around the temples.   Reports she is an anxious person normally and says she has 'more than normal going on'. Daughter's father passed away about a year ago and she has been trying to help her daughter through the grief. The patient was the first person that found him after he has passed. Her daughter has been having panic attacks and trouble with appetite. They have both been trying to manage the trauma. Her daughter has required a lot of care. In addition, she is taking care of a young puppy that the father had given to the daughter prior to his passing.    She was working at her computer this morning and felt sort of lightheaded. She got up to eat a banana because she thought maybe she was hungry. Then she got up again to get some water but felt very off balance. Felt the room spinning when she lay down. Did not eat dinner last night and that was the first time she ate today. She typically drinks about 3 bottles of dasani water a day. She has not eaten today. Reports the dizziness/lightheadness has now improved.   She is taking her cholesterol medicine and has been for the last 6 weeks approximately. Hasn't noticed any changes since starting that.    Review of Systems:  Review of Systems  Constitutional:  Negative for chills, fever, malaise/fatigue and weight loss.  HENT:   Negative for congestion and sore throat.   Eyes:  Negative for blurred vision.  Respiratory:  Negative for cough, shortness of breath and wheezing.   Cardiovascular:  Negative for chest pain, palpitations and leg swelling.  Gastrointestinal:  Negative for abdominal pain, blood in stool, constipation, diarrhea, heartburn, nausea and vomiting.  Genitourinary:  Negative for dysuria, frequency, hematuria and urgency.  Musculoskeletal:  Negative for falls and joint pain.  Skin:  Negative for rash.  Neurological:  Positive for dizziness, tremors and headaches. Negative for tingling.  Endo/Heme/Allergies:  Negative for polydipsia.  Psychiatric/Behavioral:  Negative for depression. The patient is not nervous/anxious.     Past Medical History:  Diagnosis Date   Anxiety    Past Surgical History:  Procedure Laterality Date   CHOLECYSTECTOMY     Social History:   reports that she has never smoked. She has never used smokeless tobacco. She reports current alcohol use. She reports that she does not use drugs.  Family History  Problem Relation Age of Onset   Hypertension Mother    Hypertension Sister     Medications: Patient's Medications  New Prescriptions   No medications on file  Previous Medications   FERROUS SULFATE PO    Take by mouth as needed.   HYDROCHLOROTHIAZIDE (HYDRODIURIL) 25 MG TABLET    Take 1 tablet (25 mg  total) by mouth daily.   MAGNESIUM CHLORIDE (MAGNESIUM DR PO)    Take by mouth as needed.   MULTIPLE VITAMINS-MINERALS (MULTIVITAMIN ADULT EXTRA C) CHEW    Chew by mouth as needed.   ROSUVASTATIN (CRESTOR) 5 MG TABLET    Take 1 tablet (5 mg total) by mouth daily.   VITAMIN C (ASCORBIC ACID) 500 MG TABLET    Take 500 mg by mouth daily.  Modified Medications   No medications on file  Discontinued Medications   No medications on file    Physical Exam:  Vitals:   10/18/22 1424 10/18/22 1445  BP: (!) 130/94 130/84  Pulse: 94   Temp: (!) 97.3 F (36.3 C)    TempSrc: Temporal   SpO2: 99%   Weight: 238 lb (108 kg)   Height: 5\' 7"  (1.702 m)    Body mass index is 37.28 kg/m. Wt Readings from Last 3 Encounters:  10/18/22 238 lb (108 kg)  09/12/22 235 lb 3.2 oz (106.7 kg)  05/01/22 231 lb 6.4 oz (105 kg)    Physical Exam Vitals and nursing note reviewed. Exam conducted with a chaperone present.  Constitutional:      General: She is not in acute distress.    Appearance: Normal appearance.  Cardiovascular:     Rate and Rhythm: Normal rate and regular rhythm.  Pulmonary:     Effort: No respiratory distress.     Breath sounds: Normal breath sounds.  Abdominal:     General: Bowel sounds are normal. There is no distension.     Palpations: Abdomen is soft. There is no mass.     Tenderness: There is no abdominal tenderness. There is no guarding.  Musculoskeletal:     Cervical back: Neck supple.  Lymphadenopathy:     Cervical: No cervical adenopathy.  Skin:    General: Skin is warm and dry.  Neurological:     Mental Status: She is alert and oriented to person, place, and time.  Psychiatric:        Mood and Affect: Mood normal.     Labs reviewed: Basic Metabolic Panel: Recent Labs    03/10/22 1120 04/10/22 1021  NA 135  --   K 4.8  --   CL 104  --   CO2 25  --   GLUCOSE 86  --   BUN 13  --   CREATININE 0.93  --   CALCIUM 9.8  --   TSH  --  1.40   Liver Function Tests: Recent Labs    03/10/22 1120  AST 14  ALT 9  BILITOT 0.3  PROT 7.8   No results for input(s): "LIPASE", "AMYLASE" in the last 8760 hours. No results for input(s): "AMMONIA" in the last 8760 hours. CBC: Recent Labs    03/10/22 1120 04/10/22 1019  WBC 3.4* 3.3*  NEUTROABS 1,707 2,003  HGB 10.9* 11.7  HCT 33.7* 35.9  MCV 90.6 94.7  PLT 303 258   Lipid Panel: Recent Labs    03/10/22 1120 06/26/22 0905  CHOL 212* 208*  HDL 45* 56  LDLCALC 152* 130*  TRIG 57 111  CHOLHDL 4.7 3.7   TSH: Recent Labs    04/10/22 1021  TSH 1.40    A1C: No results found for: "HGBA1C"   Assessment/Plan 1. Episode of dizziness Patient had a transient episode of dizziness. Encouraged patient to eat nutritious food regularly and not skip meals. Also discussed increasing hydration. She is aware of her chronic anxiety but declines to  start on medication at this time.  -CBC -CMP  2. Primary hypertension Pt in agreement to check BP at home, especially during episodes of dizziness or with headache. BP stable today.   3. Pure hypercholesterolemia Patient started crestor 6 weeks ago. No complaints or side effects with medication.  4. Anemia, unspecified type She is taking iron + vit C tablets. Check levels today. -CBC  5. Anxiety Pt is aware of her anxiety and likes to engage in breathing exercises to relax. She prefers not to start any medication or therapy at this time. Exercise and walking outside helps; pt in agreement that when weather cooperates she can get outside to assist with mood.  To keep follow up as scheduled, sooner if needed   Student- Kamna O'Berry ACPCNP-S  I personally was present during the history, physical exam and medical decision-making activities of this service and have verified that the service and findings are accurately documented in the student's note Breckyn Troyer K. Penuelas, [facility] [phone]

## 2022-10-19 LAB — CBC WITH DIFFERENTIAL/PLATELET
Absolute Monocytes: 359 cells/uL (ref 200–950)
Basophils Absolute: 31 cells/uL (ref 0–200)
Basophils Relative: 0.6 %
Eosinophils Absolute: 42 cells/uL (ref 15–500)
Eosinophils Relative: 0.8 %
HCT: 36.2 % (ref 35.0–45.0)
Hemoglobin: 12.5 g/dL (ref 11.7–15.5)
Lymphs Abs: 1440 cells/uL (ref 850–3900)
MCH: 33.4 pg — ABNORMAL HIGH (ref 27.0–33.0)
MCHC: 34.5 g/dL (ref 32.0–36.0)
MCV: 96.8 fL (ref 80.0–100.0)
MPV: 9.4 fL (ref 7.5–12.5)
Monocytes Relative: 6.9 %
Neutro Abs: 3328 cells/uL (ref 1500–7800)
Neutrophils Relative %: 64 %
Platelets: 291 10*3/uL (ref 140–400)
RBC: 3.74 10*6/uL — ABNORMAL LOW (ref 3.80–5.10)
RDW: 12 % (ref 11.0–15.0)
Total Lymphocyte: 27.7 %
WBC: 5.2 10*3/uL (ref 3.8–10.8)

## 2022-10-19 LAB — COMPLETE METABOLIC PANEL WITH GFR
AG Ratio: 1.2 (calc) (ref 1.0–2.5)
ALT: 11 U/L (ref 6–29)
AST: 13 U/L (ref 10–35)
Albumin: 4.2 g/dL (ref 3.6–5.1)
Alkaline phosphatase (APISO): 29 U/L — ABNORMAL LOW (ref 31–125)
BUN: 13 mg/dL (ref 7–25)
CO2: 27 mmol/L (ref 20–32)
Calcium: 9.4 mg/dL (ref 8.6–10.2)
Chloride: 99 mmol/L (ref 98–110)
Creat: 0.91 mg/dL (ref 0.50–0.99)
Globulin: 3.6 g/dL (calc) (ref 1.9–3.7)
Glucose, Bld: 86 mg/dL (ref 65–99)
Potassium: 3.5 mmol/L (ref 3.5–5.3)
Sodium: 136 mmol/L (ref 135–146)
Total Bilirubin: 0.4 mg/dL (ref 0.2–1.2)
Total Protein: 7.8 g/dL (ref 6.1–8.1)
eGFR: 77 mL/min/{1.73_m2} (ref >=60)

## 2023-03-02 ENCOUNTER — Other Ambulatory Visit: Payer: Self-pay

## 2023-03-02 DIAGNOSIS — Z113 Encounter for screening for infections with a predominantly sexual mode of transmission: Secondary | ICD-10-CM

## 2023-03-02 DIAGNOSIS — E7800 Pure hypercholesterolemia, unspecified: Secondary | ICD-10-CM

## 2023-03-02 DIAGNOSIS — I10 Essential (primary) hypertension: Secondary | ICD-10-CM

## 2023-03-02 DIAGNOSIS — Z1159 Encounter for screening for other viral diseases: Secondary | ICD-10-CM

## 2023-03-16 ENCOUNTER — Other Ambulatory Visit: Payer: 59

## 2023-03-17 LAB — LIPID PANEL
Cholesterol: 150 mg/dL (ref <200)
HDL: 44 mg/dL — ABNORMAL LOW (ref >=50)
LDL Cholesterol (Calc): 89 mg/dL (calc)
Non-HDL Cholesterol (Calc): 106 mg/dL (calc) (ref <130)
Total CHOL/HDL Ratio: 3.4 (calc) (ref <5.0)
Triglycerides: 78 mg/dL (ref <150)

## 2023-03-17 LAB — TSH: TSH: 2.07 mIU/L

## 2023-03-17 LAB — HIV ANTIBODY (ROUTINE TESTING W REFLEX): HIV 1&2 Ab, 4th Generation: NONREACTIVE

## 2023-03-17 LAB — HEPATITIS C ANTIBODY: Hepatitis C Ab: NONREACTIVE

## 2023-03-19 ENCOUNTER — Ambulatory Visit (INDEPENDENT_AMBULATORY_CARE_PROVIDER_SITE_OTHER): Payer: 59 | Admitting: Family

## 2023-03-19 ENCOUNTER — Encounter: Payer: Self-pay | Admitting: Family

## 2023-03-19 VITALS — BP 134/82 | HR 86 | Temp 97.6°F | Resp 20 | Ht 67.0 in | Wt 236.4 lb

## 2023-03-19 DIAGNOSIS — I10 Essential (primary) hypertension: Secondary | ICD-10-CM

## 2023-03-19 DIAGNOSIS — Z1231 Encounter for screening mammogram for malignant neoplasm of breast: Secondary | ICD-10-CM

## 2023-03-19 DIAGNOSIS — Z23 Encounter for immunization: Secondary | ICD-10-CM

## 2023-03-19 DIAGNOSIS — E7800 Pure hypercholesterolemia, unspecified: Secondary | ICD-10-CM | POA: Diagnosis not present

## 2023-03-19 DIAGNOSIS — F419 Anxiety disorder, unspecified: Secondary | ICD-10-CM

## 2023-03-19 NOTE — Progress Notes (Signed)
Provider: Richarda Blade FNP-C   Iveth Heidemann, Donalee Citrin, NP  Patient Care Team: Wilhemina Grall, Donalee Citrin, NP as PCP - General (Family Medicine)  Extended Emergency Contact Information Primary Emergency Contact: [facility] Address: [address] [place],  [address] Home Phone: [phone] Relation: None  Code Status:  Full Code  Goals of care: Advanced Directive information    09/12/2022    2:08 PM  Advanced Directives  Does Patient Have a Medical Advance Directive? Yes  Type of Estate agent of [place];Living will  Does patient want to make changes to medical advance directive? No - Patient declined  Copy of Healthcare Power of Attorney in Chart? No - copy requested     Chief Complaint  Patient presents with   Medical Management of Chronic Issues    Patient presents today for a 6 month follow-up    Quality Metric Gaps    Mammogram, pap smear, colonoscopy, COVID#4, zoster,TDAP    HPI:  Pt is a 52 y.o. female seen today for 6 months medical management of chronic diseases.   Recent lab work results reviewed and discussed during the visit. Previous high cholesterol has improved.  Due for pap states referral was send to [place] but would like a Gyn in [place].   Due for colonoscopy - cologuard ordered on previous visit but was send to the wrong.she will call to redeliver.  Past Medical History:  Diagnosis Date   Anxiety    Past Surgical History:  Procedure Laterality Date   CHOLECYSTECTOMY      Allergies  Allergen Reactions   Bee Pollen    Bee Venom     Allergies as of 03/19/2023       Reactions   Bee Pollen    Bee Venom         Medication List        Accurate as of March 19, 2023 10:39 AM. If you have any questions, ask your nurse or doctor.          ascorbic acid 500 MG tablet Commonly known as: VITAMIN C Take 500 mg by mouth daily.   FERROUS SULFATE PO Take by mouth as needed.   hydrochlorothiazide 25  MG tablet Commonly known as: HYDRODIURIL Take 1 tablet (25 mg total) by mouth daily.   MAGNESIUM DR PO Take by mouth as needed.   Multivitamin Adult Extra C Chew Chew by mouth as needed.   rosuvastatin 5 MG tablet Commonly known as: Crestor Take 1 tablet (5 mg total) by mouth daily.        Review of Systems  Constitutional:  Negative for appetite change, chills, fatigue, fever and unexpected weight change.  HENT:  Negative for congestion, dental problem, ear discharge, ear pain, facial swelling, hearing loss, nosebleeds, postnasal drip, rhinorrhea, sinus pressure, sinus pain, sneezing, sore throat, tinnitus and trouble swallowing.   Eyes:  Negative for pain, discharge, redness, itching and visual disturbance.  Respiratory:  Negative for cough, chest tightness, shortness of breath and wheezing.   Cardiovascular:  Negative for chest pain, palpitations and leg swelling.  Gastrointestinal:  Negative for abdominal distention, abdominal pain, blood in stool, constipation, diarrhea, nausea and vomiting.  Endocrine: Negative for cold intolerance, heat intolerance, polydipsia, polyphagia and polyuria.  Genitourinary:  Negative for difficulty urinating, dysuria, flank pain, frequency and urgency.  Musculoskeletal:  Negative for arthralgias, back pain, gait problem, joint swelling, myalgias, neck pain and neck stiffness.  Skin:  Negative  for color change, pallor, rash and wound.  Neurological:  Negative for dizziness, syncope, speech difficulty, weakness, light-headedness, numbness and headaches.  Hematological:  Does not bruise/bleed easily.  Psychiatric/Behavioral:  Negative for agitation, behavioral problems, confusion, hallucinations, self-injury, sleep disturbance and suicidal ideas. The patient is not nervous/anxious.     Immunization History  Administered Date(s) Administered   PFIZER(Purple Top)SARS-COV-2 Vaccination 12/12/2019, 01/02/2020, 07/03/2020   Td 03/06/2013   Tdap  03/06/2013   Pertinent  Health Maintenance Due  Topic Date Due   PAP SMEAR-Modifier  Never done   Colonoscopy  Never done   MAMMOGRAM  Never done   INFLUENZA VACCINE  Discontinued      11/06/2016    4:06 PM 03/10/2022   10:26 AM 04/10/2022    8:22 AM 09/12/2022    2:08 PM 10/18/2022    2:31 PM  Fall Risk  Falls in the past year? No 0 0 0 0  Was there an injury with Fall?  0 0 0 0  Fall Risk Category Calculator  0 0 0 0  Fall Risk Category (Retired)  Low Low Low   (RETIRED) Patient Fall Risk Level  Low fall risk Low fall risk Low fall risk   Patient at Risk for Falls Due to  No Fall Risks No Fall Risks No Fall Risks No Fall Risks  Fall risk Follow up  Falls evaluation completed Falls evaluation completed Falls evaluation completed Falls evaluation completed   Functional Status Survey:    Vitals:   03/19/23 1008  BP: 134/82  Pulse: 86  Resp: 20  Temp: 97.6 F (36.4 C)  SpO2: 98%  Weight: 236 lb 6.4 oz (107.2 kg)  Height: 5\' 7"  (1.702 m)   Body mass index is 37.03 kg/m. Physical Exam Vitals reviewed.  Constitutional:      General: She is not in acute distress.    Appearance: Normal appearance. She is obese. She is not ill-appearing or diaphoretic.  HENT:     Head: Normocephalic.     Right Ear: Tympanic membrane, ear canal and external ear normal. There is no impacted cerumen.     Left Ear: Tympanic membrane, ear canal and external ear normal. There is no impacted cerumen.     Nose: Nose normal. No congestion or rhinorrhea.     Mouth/Throat:     Mouth: Mucous membranes are moist.     Pharynx: Oropharynx is clear. No oropharyngeal exudate or posterior oropharyngeal erythema.  Eyes:     General: No scleral icterus.       Right eye: No discharge.        Left eye: No discharge.     Extraocular Movements: Extraocular movements intact.     Conjunctiva/sclera: Conjunctivae normal.     Pupils: Pupils are equal, round, and reactive to light.  Neck:     Vascular: No carotid  bruit.  Cardiovascular:     Rate and Rhythm: Normal rate and regular rhythm.     Pulses: Normal pulses.     Heart sounds: Normal heart sounds. No murmur heard.    No friction rub. No gallop.  Pulmonary:     Effort: Pulmonary effort is normal. No respiratory distress.     Breath sounds: Normal breath sounds. No wheezing, rhonchi or rales.  Chest:     Chest wall: No tenderness.  Abdominal:     General: Bowel sounds are normal. There is no distension.     Palpations: Abdomen is soft. There is no mass.  Tenderness: There is no abdominal tenderness. There is no right CVA tenderness, left CVA tenderness, guarding or rebound.  Musculoskeletal:        General: No swelling or tenderness. Normal range of motion.     Cervical back: Normal range of motion. No rigidity or tenderness.     Right lower leg: No edema.     Left lower leg: No edema.  Lymphadenopathy:     Cervical: No cervical adenopathy.  Skin:    General: Skin is warm and dry.     Coloration: Skin is not pale.     Findings: No bruising, erythema, lesion or rash.  Neurological:     Mental Status: She is alert and oriented to person, place, and time.     Cranial Nerves: No cranial nerve deficit.     Sensory: No sensory deficit.     Motor: No weakness.     Coordination: Coordination normal.     Gait: Gait normal.  Psychiatric:        Mood and Affect: Mood normal.        Speech: Speech normal.        Behavior: Behavior normal.        Thought Content: Thought content normal.        Judgment: Judgment normal.    Labs reviewed: Recent Labs    10/18/22 1615  NA 136  K 3.5  CL 99  CO2 27  GLUCOSE 86  BUN 13  CREATININE 0.91  CALCIUM 9.4   Recent Labs    10/18/22 1615  AST 13  ALT 11  BILITOT 0.4  PROT 7.8   Recent Labs    04/10/22 1019 10/18/22 1615  WBC 3.3* 5.2  NEUTROABS 2,003 3,328  HGB 11.7 12.5  HCT 35.9 36.2  MCV 94.7 96.8  PLT 258 291    No results found for: "HGBA1C" Lab Results  Component  Value Date   CHOL 150 03/16/2023   HDL 44 (L) 03/16/2023   LDLCALC 89 03/16/2023   TRIG 78 03/16/2023   CHOLHDL 3.4 03/16/2023    Significant Diagnostic Results in last 30 days:  No results found.  Assessment/Plan 1. Primary hypertension B/p well controlled  - continue on Hydrochlorothiazide  - dietary modification and exercise advised  - TSH; Future - COMPLETE METABOLIC PANEL WITH GFR; Future - CBC with Differential/Platelet; Future  2. Pure hypercholesterolemia - continue on Rosuvastatin  - Lipid panel; Future  3. Anxiety Stable  - advised to notify provider if symptoms worsen  Recommend sertraline  - TSH; Future  4. Breast cancer screening by mammogram Asymptomatic  - MM DIGITAL SCREENING BILATERAL  5. Need for diphtheria-tetanus-pertussis (Tdap) vaccine Afebrile  - Tdap vaccine administered today by CMA no reaction reported.  - Tdap vaccine greater than or equal to 7yo IM  Family/ staff Communication: Reviewed plan of care with patient verbalized understanding.  Labs/tests ordered:  - TSH; Future - COMPLETE METABOLIC PANEL WITH GFR; Future - CBC with Differential/Platelet; Future - Lipid panel; Future - MM DIGITAL SCREENING BILATERAL  Next Appointment : Return in about 6 months (around 09/18/2023) for medical mangement of chronic issues., Fasting labs in 6 months prior to visit.   Caesar Bookman, NP

## 2023-03-30 DIAGNOSIS — F419 Anxiety disorder, unspecified: Secondary | ICD-10-CM | POA: Insufficient documentation

## 2023-03-30 DIAGNOSIS — E7800 Pure hypercholesterolemia, unspecified: Secondary | ICD-10-CM | POA: Insufficient documentation

## 2023-07-19 ENCOUNTER — Other Ambulatory Visit: Payer: Self-pay | Admitting: Family

## 2023-08-27 ENCOUNTER — Telehealth: Payer: Self-pay | Admitting: Family

## 2023-08-27 MED ORDER — ROSUVASTATIN CALCIUM 5 MG PO TABS: 5.0000 mg | ORAL_TABLET | Freq: Every day | ORAL | 1 refills | Status: DC

## 2023-08-27 NOTE — Telephone Encounter (Signed)
Rx sent in as requested. Patient is aware

## 2023-08-27 NOTE — Telephone Encounter (Signed)
Patient called requesting refill of rosuvastatin. The pharmacy called her and said they were waiting for [place] to put the prescription in.

## 2023-09-10 ENCOUNTER — Other Ambulatory Visit: Payer: Self-pay

## 2023-09-10 DIAGNOSIS — I10 Essential (primary) hypertension: Secondary | ICD-10-CM

## 2023-09-10 DIAGNOSIS — E7800 Pure hypercholesterolemia, unspecified: Secondary | ICD-10-CM

## 2023-09-10 DIAGNOSIS — F419 Anxiety disorder, unspecified: Secondary | ICD-10-CM

## 2023-09-14 ENCOUNTER — Other Ambulatory Visit: Payer: 59

## 2023-09-17 ENCOUNTER — Encounter: Payer: Self-pay | Admitting: Adult Health

## 2023-09-17 ENCOUNTER — Ambulatory Visit (INDEPENDENT_AMBULATORY_CARE_PROVIDER_SITE_OTHER): Payer: 59 | Admitting: Adult Health

## 2023-09-17 VITALS — BP 127/88 | HR 90 | Temp 97.4°F | Resp 18 | Ht 67.0 in | Wt 246.2 lb

## 2023-09-17 DIAGNOSIS — Z131 Encounter for screening for diabetes mellitus: Secondary | ICD-10-CM

## 2023-09-17 DIAGNOSIS — J309 Allergic rhinitis, unspecified: Secondary | ICD-10-CM

## 2023-09-17 DIAGNOSIS — D649 Anemia, unspecified: Secondary | ICD-10-CM

## 2023-09-17 DIAGNOSIS — E66812 Obesity, class 2: Secondary | ICD-10-CM

## 2023-09-17 DIAGNOSIS — Z1211 Encounter for screening for malignant neoplasm of colon: Secondary | ICD-10-CM

## 2023-09-17 DIAGNOSIS — E7800 Pure hypercholesterolemia, unspecified: Secondary | ICD-10-CM | POA: Diagnosis not present

## 2023-09-17 DIAGNOSIS — I10 Essential (primary) hypertension: Secondary | ICD-10-CM

## 2023-09-17 DIAGNOSIS — Z6838 Body mass index (BMI) 38.0-38.9, adult: Secondary | ICD-10-CM

## 2023-09-17 MED ORDER — ROSUVASTATIN CALCIUM 5 MG PO TABS: 5.0000 mg | ORAL_TABLET | Freq: Every day | ORAL | 6 refills | Status: AC

## 2023-09-17 MED ORDER — HYDROCHLOROTHIAZIDE 25 MG PO TABS: 25.0000 mg | ORAL_TABLET | Freq: Every day | ORAL | 1 refills | Status: DC

## 2023-09-17 MED ORDER — FLUTICASONE PROPIONATE 50 MCG/ACT NA SUSP: 2.0000 | Freq: Every day | NASAL | 6 refills | Status: AC | PRN

## 2023-09-17 MED ORDER — HYDROCHLOROTHIAZIDE 25 MG PO TABS: 25.0000 mg | ORAL_TABLET | Freq: Every day | ORAL | 6 refills | Status: DC

## 2023-09-17 MED ORDER — ROSUVASTATIN CALCIUM 5 MG PO TABS: 5.0000 mg | ORAL_TABLET | Freq: Every day | ORAL | 1 refills | Status: DC

## 2023-09-17 NOTE — Progress Notes (Signed)
[facility] clinic  Provider:  Kenard Gower DNP  Code Status:  Full Code  Goals of Care:     09/12/2022    2:08 PM  Advanced Directives  Does Patient Have a Medical Advance Directive? Yes  Type of Estate agent of [place];Living will  Does patient want to make changes to medical advance directive? No - Patient declined  Copy of Healthcare Power of Attorney in Chart? No - copy requested     Chief Complaint  Patient presents with   Medical Management of Chronic Issues    Fasting labs and see provider 6 months follow-up   Immunizations    Covid and Shingrix   Health Maintenance    Colonoscopy, Mammogram and Cervical Cancer screening      HPI: Patient is a 52 y.o. female seen today for an acute visit for  Primary hypertension -  BP 127/88, takes HCTZ  Pure hypercholesterolemia -  takes Crestor  Anemia, unspecified type -  tends to be anemic, stopped taking FeSO4 but recently started it back  Colon cancer screening - no bloody stools nor abdominal pain  Past Medical History:  Diagnosis Date   Anxiety     Past Surgical History:  Procedure Laterality Date   CHOLECYSTECTOMY      Allergies  Allergen Reactions   Bee Pollen    Bee Venom     Outpatient Encounter Medications as of 09/17/2023  Medication Sig   FERROUS SULFATE PO Take by mouth as needed.   hydrochlorothiazide (HYDRODIURIL) 25 MG tablet Take 1 tablet (25 mg total) by mouth daily.   Magnesium Chloride (MAGNESIUM DR PO) Take by mouth as needed.   Multiple Vitamins-Minerals (MULTIVITAMIN ADULT EXTRA C) CHEW Chew by mouth as needed.   rosuvastatin (CRESTOR) 5 MG tablet Take 1 tablet (5 mg total) by mouth daily.   vitamin C (ASCORBIC ACID) 500 MG tablet Take 500 mg by mouth daily.   No facility-administered encounter medications on file as of 09/17/2023.    Review of Systems:  Review of Systems  Constitutional:  Negative for appetite change, chills, fatigue and fever.  HENT:   Negative for congestion, hearing loss, rhinorrhea and sore throat.   Eyes: Negative.   Respiratory:  Negative for cough, shortness of breath and wheezing.   Cardiovascular:  Negative for chest pain, palpitations and leg swelling.  Gastrointestinal:  Negative for abdominal pain, constipation, diarrhea, nausea and vomiting.  Genitourinary:  Negative for dysuria.  Musculoskeletal:  Negative for arthralgias, back pain and myalgias.  Skin:  Negative for color change, rash and wound.  Neurological:  Negative for dizziness, weakness and headaches.  Psychiatric/Behavioral:  Negative for behavioral problems. The patient is not nervous/anxious.     Health Maintenance  Topic Date Due   Cervical Cancer Screening (HPV/Pap Cotest)  Never done   Colonoscopy  Never done   MAMMOGRAM  Never done   Zoster Vaccines- Shingrix (1 of 2) Never done   COVID-19 Vaccine (4 - 2024-25 season) 06/03/2023   DTaP/Tdap/Td (4 - Td or Tdap) 03/18/2033   Hepatitis C Screening  Completed   HIV Screening  Completed   HPV VACCINES  Aged Out   INFLUENZA VACCINE  Discontinued    Physical Exam: Vitals:   09/17/23 1031  BP: 127/88  Pulse: 90  Resp: 18  Temp: (!) 97.4 F (36.3 C)  SpO2: 99%  Weight: 246 lb 3.2 oz (111.7 kg)  Height: 5\' 7"  (1.702 m)   Body mass index is 38.56 kg/m.  Physical Exam Constitutional:      Appearance: She is obese.  HENT:     Head: Normocephalic and atraumatic.     Nose: Nose normal.     Mouth/Throat:     Mouth: Mucous membranes are moist.  Eyes:     Conjunctiva/sclera: Conjunctivae normal.  Cardiovascular:     Rate and Rhythm: Normal rate and regular rhythm.  Pulmonary:     Effort: Pulmonary effort is normal.     Breath sounds: Normal breath sounds.  Abdominal:     General: Bowel sounds are normal.     Palpations: Abdomen is soft.  Musculoskeletal:        General: Normal range of motion.     Cervical back: Normal range of motion.  Skin:    General: Skin is warm and dry.   Neurological:     General: No focal deficit present.     Mental Status: She is alert and oriented to person, place, and time.  Psychiatric:        Mood and Affect: Mood normal.        Behavior: Behavior normal.        Thought Content: Thought content normal.        Judgment: Judgment normal.     Labs reviewed: Basic Metabolic Panel: Recent Labs    10/18/22 1615 03/16/23 0820  NA 136  --   K 3.5  --   CL 99  --   CO2 27  --   GLUCOSE 86  --   BUN 13  --   CREATININE 0.91  --   CALCIUM 9.4  --   TSH  --  2.07   Liver Function Tests: Recent Labs    10/18/22 1615  AST 13  ALT 11  BILITOT 0.4  PROT 7.8   No results for input(s): "LIPASE", "AMYLASE" in the last 8760 hours. No results for input(s): "AMMONIA" in the last 8760 hours. CBC: Recent Labs    10/18/22 1615  WBC 5.2  NEUTROABS 3,328  HGB 12.5  HCT 36.2  MCV 96.8  PLT 291   Lipid Panel: Recent Labs    03/16/23 0820  CHOL 150  HDL 44*  LDLCALC 89  TRIG 78  CHOLHDL 3.4   No results found for: "HGBA1C"  Procedures since last visit: No results found.  Assessment/Plan  1. Primary hypertension (Primary) -  BP 127/88, stable -    Continue hydrochlorothiazide - hydrochlorothiazide (HYDRODIURIL) 25 MG tablet; Take 1 tablet (25 mg total) by mouth daily.  Dispense: 30 tablet; Refill: 6  2. Pure hypercholesterolemia Lab Results  Component Value Date   CHOL 156 09/17/2023   HDL 52 09/17/2023   LDLCALC 87 09/17/2023   TRIG 78 09/17/2023   CHOLHDL 3.0 09/17/2023    -  at goal -   Continue Crestor - rosuvastatin (CRESTOR) 5 MG tablet; Take 1 tablet (5 mg total) by mouth daily.  Dispense: 30 tablet; Refill: 6  3. Anemia, unspecified type -   hgb 12.5, 10/18/22 -  recently -re-started FeSO4  4. Colon cancer screening -  denies bloody stools - Cologuard  5. Class 2 obesity with body mass index (BMI) of 38.0 to 38.9 in adult, unspecified obesity type, unspecified whether serious comorbidity  present -  Body mass index is 38.56 kg/m.  -  counseled on diet and exercise  6. Screening for diabetes mellitus - Hemoglobin A1C  7. Allergic rhinitis, unspecified seasonality, unspecified trigger - fluticasone (FLONASE) 50 MCG/ACT nasal spray;  Place 2 sprays into both nostrils daily as needed for allergies or rhinitis.  Dispense: 16 g; Refill: 6     Labs/tests ordered:   A1C  Next appt:  Visit date not found

## 2023-09-18 LAB — COMPLETE METABOLIC PANEL WITH GFR
AG Ratio: 1.2 (calc) (ref 1.0–2.5)
ALT: 12 U/L (ref 6–29)
AST: 15 U/L (ref 10–35)
Albumin: 4.3 g/dL (ref 3.6–5.1)
Alkaline phosphatase (APISO): 32 U/L — ABNORMAL LOW (ref 37–153)
BUN/Creatinine Ratio: 13 (calc) (ref 6–22)
BUN: 14 mg/dL (ref 7–25)
CO2: 28 mmol/L (ref 20–32)
Calcium: 9.4 mg/dL (ref 8.6–10.4)
Chloride: 100 mmol/L (ref 98–110)
Creat: 1.05 mg/dL — ABNORMAL HIGH (ref 0.50–1.03)
Globulin: 3.5 g/dL (ref 1.9–3.7)
Glucose, Bld: 92 mg/dL (ref 65–99)
Potassium: 3.5 mmol/L (ref 3.5–5.3)
Sodium: 137 mmol/L (ref 135–146)
Total Bilirubin: 0.4 mg/dL (ref 0.2–1.2)
Total Protein: 7.8 g/dL (ref 6.1–8.1)
eGFR: 65 mL/min/{1.73_m2} (ref >=60)

## 2023-09-18 LAB — CBC WITH DIFFERENTIAL/PLATELET
Absolute Lymphocytes: 995 {cells}/uL (ref 850–3900)
Absolute Monocytes: 362 {cells}/uL (ref 200–950)
Basophils Absolute: 29 {cells}/uL (ref 0–200)
Basophils Relative: 0.9 %
Eosinophils Absolute: 192 {cells}/uL (ref 15–500)
Eosinophils Relative: 6 %
HCT: 38.7 % (ref 35.0–45.0)
Hemoglobin: 13 g/dL (ref 11.7–15.5)
MCH: 32.8 pg (ref 27.0–33.0)
MCHC: 33.6 g/dL (ref 32.0–36.0)
MCV: 97.7 fL (ref 80.0–100.0)
MPV: 9.2 fL (ref 7.5–12.5)
Monocytes Relative: 11.3 %
Neutro Abs: 1622 {cells}/uL (ref 1500–7800)
Neutrophils Relative %: 50.7 %
Platelets: 294 10*3/uL (ref 140–400)
RBC: 3.96 10*6/uL (ref 3.80–5.10)
RDW: 12.1 % (ref 11.0–15.0)
Total Lymphocyte: 31.1 %
WBC: 3.2 10*3/uL — ABNORMAL LOW (ref 3.8–10.8)

## 2023-09-18 LAB — LIPID PANEL
Cholesterol: 156 mg/dL (ref <200)
HDL: 52 mg/dL (ref >=50)
LDL Cholesterol (Calc): 87 mg/dL
Non-HDL Cholesterol (Calc): 104 mg/dL (ref <130)
Total CHOL/HDL Ratio: 3 (calc) (ref <5.0)
Triglycerides: 78 mg/dL (ref <150)

## 2023-09-18 LAB — HEMOGLOBIN A1C
Hgb A1c MFr Bld: 5.7 %{Hb} — ABNORMAL HIGH (ref <5.7)
Mean Plasma Glucose: 117 mg/dL
eAG (mmol/L): 6.5 mmol/L

## 2023-09-18 LAB — TSH: TSH: 1.11 m[IU]/L

## 2023-11-16 ENCOUNTER — Other Ambulatory Visit: Payer: Self-pay | Admitting: Family

## 2024-03-17 ENCOUNTER — Ambulatory Visit: Payer: 59 | Admitting: Family

## 2024-03-31 ENCOUNTER — Ambulatory Visit: Admitting: Nurse Practitioner

## 2024-05-27 ENCOUNTER — Other Ambulatory Visit: Payer: Self-pay | Admitting: Adult Health

## 2024-05-27 DIAGNOSIS — I10 Essential (primary) hypertension: Secondary | ICD-10-CM

## 2024-10-10 ENCOUNTER — Other Ambulatory Visit: Payer: Self-pay | Admitting: Adult Health

## 2024-10-10 DIAGNOSIS — E7800 Pure hypercholesterolemia, unspecified: Secondary | ICD-10-CM

## 2024-10-10 NOTE — Telephone Encounter (Signed)
 Please verify with patient if still following up with [facility] with an update PCP.  If still following up recommend scheduling follow-up appointment as soon as possible prior to refilling medication.

## 2024-10-10 NOTE — Telephone Encounter (Signed)
 Pharmacy sent refill request.  Patient has not been seen in over a year. Tried calling patient, NA. Sent MyChart message to patient.   Pended Rx and sent to [facility] for Approval/Denial.

## 2024-10-15 NOTE — Telephone Encounter (Signed)
 Tried calling patient and sent [employer]. No Response.

## 2024-10-16 NOTE — Telephone Encounter (Signed)
 Tried calling patient. MyChart Message also sent. No Response.
